# Patient Record
Sex: Female | Born: 1950 | Race: White | Hispanic: No
Health system: Southern US, Community
[De-identification: ages and names within clinical notes are randomized; demographics above are authoritative.]

## PROBLEM LIST (undated history)

## (undated) DIAGNOSIS — F0390 Unspecified dementia without behavioral disturbance: Secondary | ICD-10-CM

## (undated) HISTORY — PX: CHOLECYSTECTOMY: SHX55

## (undated) HISTORY — PX: ARM WOUND REPAIR / CLOSURE: SUR1141

## (undated) HISTORY — PX: NECK SURGERY: SHX720

## (undated) HISTORY — PX: ABDOMINAL HYSTERECTOMY: SHX81

---

## 2021-10-06 ENCOUNTER — Emergency Department (HOSPITAL_BASED_OUTPATIENT_CLINIC_OR_DEPARTMENT_OTHER)
Admission: EM | Admit: 2021-10-06 | Discharge: 2021-10-06 | Disposition: A | Discharge status: Home / Self Care | Payer: Medicare Other | Attending: Emergency Medicine | Admitting: Emergency Medicine

## 2021-10-06 ENCOUNTER — Encounter (HOSPITAL_BASED_OUTPATIENT_CLINIC_OR_DEPARTMENT_OTHER): Payer: Self-pay

## 2021-10-06 ENCOUNTER — Other Ambulatory Visit: Payer: Self-pay

## 2021-10-06 DIAGNOSIS — Z20822 Contact with and (suspected) exposure to covid-19: Secondary | ICD-10-CM | POA: Diagnosis not present

## 2021-10-06 DIAGNOSIS — F0390 Unspecified dementia without behavioral disturbance: Secondary | ICD-10-CM | POA: Insufficient documentation

## 2021-10-06 DIAGNOSIS — B349 Viral infection, unspecified: Secondary | ICD-10-CM | POA: Diagnosis not present

## 2021-10-06 DIAGNOSIS — R059 Cough, unspecified: Secondary | ICD-10-CM | POA: Diagnosis present

## 2021-10-06 HISTORY — DX: Unspecified dementia, unspecified severity, without behavioral disturbance, psychotic disturbance, mood disturbance, and anxiety: F03.90

## 2021-10-06 LAB — RESP PANEL BY RT-PCR (FLU A&B, COVID) ARPGX2
Influenza A by PCR: NEGATIVE
Influenza B by PCR: NEGATIVE
SARS Coronavirus 2 by RT PCR: NEGATIVE

## 2021-10-06 NOTE — ED Provider Notes (Signed)
[facility]   CSN: [id number] Arrival date & time: 10/06/21  1619     History Chief Complaint  Patient presents with   Cough   Fever   Chills    Dawn Mills is a 70 y.o. female with a history of dementia presented due to viral-like syndrome.  Multiple sick family members in the house, including her son, who is currently patient.  This patient has had cough, congestion, body aches, fevers and chills for about 6 days.  She is eating and drinking okay.  HPI     Past Medical History:  Diagnosis Date   Dementia (HCC)     There are no problems to display for this patient.   Past Surgical History:  Procedure Laterality Date   ABDOMINAL HYSTERECTOMY     ARM WOUND REPAIR / CLOSURE     CHOLECYSTECTOMY       OB History   No obstetric history on file.     No family history on file.  Social History   Tobacco Use   Smoking status: Never   Smokeless tobacco: Never  Substance Use Topics   Alcohol use: Never   Drug use: Never    Home Medications Prior to Admission medications   Not on File    Allergies    Patient has no known allergies.  Review of Systems   Review of Systems  Constitutional:  Positive for appetite change, chills, fatigue and fever.  Eyes:  Negative for pain and visual disturbance.  Respiratory:  Positive for cough. Negative for shortness of breath.   Cardiovascular:  Negative for chest pain and palpitations.  Gastrointestinal:  Negative for abdominal pain and vomiting.  Genitourinary:  Negative for dysuria and hematuria.  Musculoskeletal:  Positive for arthralgias and myalgias.  Skin:  Negative for color change and rash.  Neurological:  Positive for headaches. Negative for syncope.  All other systems reviewed and are negative.  Physical Exam Updated Vital Signs BP 135/74 (BP Location: Right Arm)   Pulse 85   Temp 98.2 F (36.8 C)   Resp 17   SpO2 97%   Physical Exam Constitutional:       General: She is not in acute distress. HENT:     Head: Normocephalic and atraumatic.  Eyes:     Conjunctiva/sclera: Conjunctivae normal.     Pupils: Pupils are equal, round, and reactive to light.  Cardiovascular:     Rate and Rhythm: Normal rate and regular rhythm.  Pulmonary:     Effort: Pulmonary effort is normal. No respiratory distress.  Abdominal:     General: There is no distension.     Tenderness: There is no abdominal tenderness.  Skin:    General: Skin is warm and dry.  Neurological:     General: No focal deficit present.     Mental Status: She is alert. Mental status is at baseline.  Psychiatric:        Mood and Affect: Mood normal.        Behavior: Behavior normal.    ED Results / Procedures / Treatments   Labs (all labs ordered are listed, but only abnormal results are displayed) Labs Reviewed  RESP PANEL BY RT-PCR (FLU A&B, COVID) [id number]    EKG None  Radiology No results found.  Procedures Procedures   Medications Ordered in ED Medications - No data to display  ED Course  I have reviewed the triage vital signs and the nursing notes.  Pertinent labs &  imaging results that were available during my care of the patient were reviewed by me and considered in my medical decision making (see chart for details).  Patient is here with suspected viral syndrome, multiple sick family members.  She has no hypoxia, no SIRS criteria, I doubt PE, doubt sepsis or pneumonia.  She is clinically quite well-appearing.  No evidence of distress.  She does have a dry cough which is very mild and intermittent on exam.  Advised conservative measures for suspected viral syndrome.  She appears to be getting better at this point.  Her COVID and flu are negative.  She and her son verbalized understanding     Final Clinical Impression(s) / ED Diagnoses Final diagnoses:  Viral syndrome    Rx / DC Orders ED Discharge Orders     None        Heitor Steinhoff, [name], MD 10/06/21  2344

## 2021-10-06 NOTE — ED Notes (Addendum)
MD at bedside  1911: Report given to [facility] for continuation of care.

## 2021-10-06 NOTE — ED Triage Notes (Signed)
Pt presents with son, per son pt has dementia and need his assistance. Pt reports cough, fever, chills, body aches and fatigue since Thursday. Pt's grandson has similar symptoms, son ain triage here for the same symptoms as well.

## 2021-12-12 ENCOUNTER — Encounter (HOSPITAL_BASED_OUTPATIENT_CLINIC_OR_DEPARTMENT_OTHER): Payer: Self-pay | Admitting: Emergency Medicine

## 2021-12-12 ENCOUNTER — Emergency Department (HOSPITAL_BASED_OUTPATIENT_CLINIC_OR_DEPARTMENT_OTHER)
Admission: EM | Admit: 2021-12-12 | Discharge: 2021-12-12 | Disposition: A | Discharge status: Home / Self Care | Payer: Medicare Other | Attending: Emergency Medicine | Admitting: Emergency Medicine

## 2021-12-12 ENCOUNTER — Other Ambulatory Visit: Payer: Self-pay

## 2021-12-12 DIAGNOSIS — F0390 Unspecified dementia without behavioral disturbance: Secondary | ICD-10-CM | POA: Insufficient documentation

## 2021-12-12 DIAGNOSIS — N390 Urinary tract infection, site not specified: Secondary | ICD-10-CM | POA: Diagnosis not present

## 2021-12-12 DIAGNOSIS — R4182 Altered mental status, unspecified: Secondary | ICD-10-CM | POA: Diagnosis present

## 2021-12-12 LAB — CBC WITH DIFFERENTIAL/PLATELET
Abs Immature Granulocytes: 0.01 10*3/uL (ref 0.00–0.07)
Basophils Absolute: 0 10*3/uL (ref 0.0–0.1)
Basophils Relative: 1 %
Eosinophils Absolute: 0.1 10*3/uL (ref 0.0–0.5)
Eosinophils Relative: 2 %
HCT: 38.4 % (ref 36.0–46.0)
Hemoglobin: 12.8 g/dL (ref 12.0–15.0)
Immature Granulocytes: 0 %
Lymphocytes Relative: 40 %
Lymphs Abs: 1.8 10*3/uL (ref 0.7–4.0)
MCH: 31.2 pg (ref 26.0–34.0)
MCHC: 33.3 g/dL (ref 30.0–36.0)
MCV: 93.7 fL (ref 80.0–100.0)
Monocytes Absolute: 0.4 10*3/uL (ref 0.1–1.0)
Monocytes Relative: 8 %
Neutro Abs: 2.2 10*3/uL (ref 1.7–7.7)
Neutrophils Relative %: 49 %
Platelets: 180 10*3/uL (ref 150–400)
RBC: 4.1 MIL/uL (ref 3.87–5.11)
RDW: 12.5 % (ref 11.5–15.5)
WBC: 4.5 10*3/uL (ref 4.0–10.5)
nRBC: 0 % (ref 0.0–0.2)

## 2021-12-12 LAB — BASIC METABOLIC PANEL
Anion gap: 8 (ref 5–15)
BUN: 13 mg/dL (ref 8–23)
CO2: 27 mmol/L (ref 22–32)
Calcium: 9.1 mg/dL (ref 8.9–10.3)
Chloride: 107 mmol/L (ref 98–111)
Creatinine, Ser: 0.65 mg/dL (ref 0.44–1.00)
GFR, Estimated: >60 mL/min (ref >60)
Glucose, Bld: 130 mg/dL — ABNORMAL HIGH (ref 70–99)
Potassium: 3.4 mmol/L — ABNORMAL LOW (ref 3.5–5.1)
Sodium: 142 mmol/L (ref 135–145)

## 2021-12-12 LAB — URINALYSIS, ROUTINE W REFLEX MICROSCOPIC
Bilirubin Urine: NEGATIVE
Glucose, UA: NEGATIVE mg/dL
Hgb urine dipstick: NEGATIVE
Ketones, ur: NEGATIVE mg/dL
Nitrite: NEGATIVE
Protein, ur: NEGATIVE mg/dL
Specific Gravity, Urine: 1.005 (ref 1.005–1.030)
pH: 5.5 (ref 5.0–8.0)

## 2021-12-12 MED ORDER — CEPHALEXIN 500 MG PO CAPS: 500.0000 mg | ORAL_CAPSULE | Freq: Two times a day (BID) | ORAL | 0 refills | Status: AC

## 2021-12-12 MED ORDER — SODIUM CHLORIDE 0.9 % IV SOLN
1.0000 g | Freq: Once | INTRAVENOUS | Status: AC
  Administered 2021-12-12: 1 g via INTRAVENOUS
  Filled 2021-12-12: qty 10

## 2021-12-12 NOTE — ED Provider Notes (Signed)
[place] EMERGENCY DEPT Provider Note   CSN: [id number] Arrival date & time: 12/12/21  1758     History  Chief Complaint  Patient presents with   Altered Mental Status    Dawn Mills is a 71 y.o. female with a history of dementia presenting to the emergency department with concern for confusion.  The patient's son is present at bedside provides supplemental history.  He reports that the patient moved in with him several months ago from [place], back in March of last year, due to progressively worsening dementia and inability to care for herself.  He says for the past several days she her confusion appears to have worsened, where she is often not making sense and her room is cluttered.  He says that she behaved this way when she had a UTI in the past and wonders whether she may have one now.  He does report he needs additional assistance at home keeping an eye on his mother, as he is her primary caretaker at this time.  They did attempt to establish care with a neurologist at [facility] for dementia, but he says that there was some disagreement between the patient and the neurology office, and he feels that he needs a new neurologist.  The patient herself appears pleasantly demented, has no acute complaints, denies pain anywhere.  HPI     Home Medications Prior to Admission medications   Medication Sig Start Date End Date Taking? Authorizing Provider  cephALEXin (KEFLEX) 500 MG capsule Take 1 capsule (500 mg total) by mouth 2 (two) times daily for 5 days. 12/13/21 12/18/21 Yes Dailynn Nancarrow, Carola Rhine, MD      Allergies    Patient has no known allergies.    Review of Systems   Review of Systems  Physical Exam Updated Vital Signs BP 131/75 (BP Location: Left Arm)    Pulse 76    Temp 98.8 F (37.1 C)    Resp 17    Ht 5\' 4"  (1.626 m)    Wt 61.7 kg    SpO2 99%    BMI 23.34 kg/m  Physical Exam Constitutional:      General: She is not in acute distress. HENT:      Head: Normocephalic and atraumatic.  Eyes:     Conjunctiva/sclera: Conjunctivae normal.     Pupils: Pupils are equal, round, and reactive to light.  Cardiovascular:     Rate and Rhythm: Normal rate and regular rhythm.  Pulmonary:     Effort: Pulmonary effort is normal. No respiratory distress.  Skin:    General: Skin is warm and dry.  Neurological:     General: No focal deficit present.     Mental Status: She is alert. Mental status is at baseline.    ED Results / Procedures / Treatments   Labs (all labs ordered are listed, but only abnormal results are displayed) Labs Reviewed  URINALYSIS, ROUTINE W REFLEX MICROSCOPIC - Abnormal; Notable for the following components:      Result Value   Color, Urine COLORLESS (*)    Leukocytes,Ua MODERATE (*)    Bacteria, UA FEW (*)    Non Squamous Epithelial 0-5 (*)    All other components within normal limits  BASIC METABOLIC PANEL - Abnormal; Notable for the following components:   Potassium 3.4 (*)    Glucose, Bld 130 (*)    All other components within normal limits  URINE CULTURE  CBC WITH DIFFERENTIAL/PLATELET    EKG None  Radiology  No results found.  Procedures Procedures    Medications Ordered in ED Medications  cefTRIAXone (ROCEPHIN) 1 g in sodium chloride 0.9 % 100 mL IVPB (0 g Intravenous Stopped 12/12/21 2020)    ED Course/ Medical Decision Making/ A&P                           Medical Decision Making  This patient presents to the ED with concern for behavioral changes. This involves an extensive number of treatment options, and is a complaint that carries with it a high risk of complications and morbidity.  The differential diagnosis includes worsening dementia vs UTI vs other  Co-morbidities that complicate the patient evaluation: dementia  Additional history obtained from patient's son at bedside  I ordered and personally interpreted labs.  The pertinent results include:  UA with + leuks, neg nitrites, CBC  and BMP wnl   I ordered medication including IV rocephin for UTI I have reviewed the patients home medicines and have made adjustments as needed  Test Considered:  - Doubt sepsis, intraabdominal surgical emergency, or stroke - do not feel additional CT imaging warranted at this time   After the interventions noted above, I reevaluated the patient and found that they have: stayed the same  Social Determinants of Health:dementia - TOC/home health consult placed, after discussion with son  Dispostion:  After consideration of the diagnostic results and the patients response to treatment, I feel that the patent would benefit from antibiotics for likely UTI, outpatient f/u - referral placed to neurology to help expedite office evaluation for his mother's rapidly progressing dementia.  Patient's son in agreement with plan of action. Okay for discharge            Final Clinical Impression(s) / ED Diagnoses Final diagnoses:  Urinary tract infection without hematuria, site unspecified  Dementia, unspecified dementia severity, unspecified dementia type, unspecified whether behavioral, psychotic, or mood disturbance or anxiety ([place])    Rx / DC Orders ED Discharge Orders          Ordered    cephALEXin (KEFLEX) 500 MG capsule  2 times daily        12/12/21 2003    Ambulatory referral to Neurology       Comments: An appointment is requested in approximately: 1 week Evaluation for rapidly progressing dementia, new patient care   12/12/21 2003              Wyvonnia Dusky, MD 12/13/21 1012

## 2021-12-12 NOTE — ED Triage Notes (Signed)
Per patient, she has felt a bit dizzy off and on for past week.  Per son, pt has new dx of dementia from PMD's in [place] where she lived until recently.  Pt's son states his mother has been acting very unusual around the house and that the last time she acted this way she had a UTI.

## 2021-12-12 NOTE — ED Triage Notes (Signed)
Pt alert and talkative during triage.  Denies urinary symptoms.  States dizzy spells come and go with a headache.  Pt recently moved in with son.  Son states pt has "not been acting like herself."  Pt denies any awareness of altered mental state, but acknowledges that she has become irate with her son more often recently.

## 2021-12-13 NOTE — TOC Transition Note (Addendum)
Transition of Care [facility]) - CM/SW Discharge Note   Patient Details  Name: Dawn Mills MRN: [id number] Date of Birth: [birth year]  [facility] CM/SW Contact:  Verdell Carmine, RN Phone Number: 12/13/2021, 1:23 PM   Clinical Narrative:     Home Health orders seen, accepted by [facility] They will call and schedule with patient and family. Jacquenette Shone made aware via phone        Patient Goals and CMS Choice        Discharge Placement             Home with [place]          Discharge Plan and Services                          HH Arranged: PT, OT, Nurse's Aide, Social Work [facility] Agency: [place] Date [place]: 12/13/21 Time [place]: [place] Representative spoke with at [place]: [place] ([place]) Interventions     Readmission Risk Interventions No flowsheet data found.

## 2021-12-13 NOTE — Care Management (Signed)
Called [facility]  to accept for home health.

## 2021-12-14 LAB — URINE CULTURE: Culture: <10000 — AB

## 2021-12-16 ENCOUNTER — Encounter: Payer: Self-pay | Admitting: Physician Assistant

## 2021-12-18 ENCOUNTER — Ambulatory Visit: Payer: Medicare Other | Admitting: Physician Assistant

## 2021-12-22 ENCOUNTER — Emergency Department (HOSPITAL_BASED_OUTPATIENT_CLINIC_OR_DEPARTMENT_OTHER)
Admission: EM | Admit: 2021-12-22 | Discharge: 2021-12-22 | Disposition: A | Discharge status: Home / Self Care | Payer: Medicare Other | Attending: Emergency Medicine | Admitting: Emergency Medicine

## 2021-12-22 ENCOUNTER — Other Ambulatory Visit: Payer: Self-pay

## 2021-12-22 ENCOUNTER — Encounter (HOSPITAL_BASED_OUTPATIENT_CLINIC_OR_DEPARTMENT_OTHER): Payer: Self-pay

## 2021-12-22 DIAGNOSIS — R4689 Other symptoms and signs involving appearance and behavior: Secondary | ICD-10-CM

## 2021-12-22 DIAGNOSIS — R42 Dizziness and giddiness: Secondary | ICD-10-CM | POA: Insufficient documentation

## 2021-12-22 LAB — URINALYSIS, ROUTINE W REFLEX MICROSCOPIC
Bilirubin Urine: NEGATIVE
Glucose, UA: NEGATIVE mg/dL
Hgb urine dipstick: NEGATIVE
Ketones, ur: NEGATIVE mg/dL
Nitrite: NEGATIVE
Protein, ur: NEGATIVE mg/dL
Specific Gravity, Urine: 1.018 (ref 1.005–1.030)
pH: 6.5 (ref 5.0–8.0)

## 2021-12-22 LAB — CBC WITH DIFFERENTIAL/PLATELET
Abs Immature Granulocytes: 0.01 10*3/uL (ref 0.00–0.07)
Basophils Absolute: 0 10*3/uL (ref 0.0–0.1)
Basophils Relative: 1 %
Eosinophils Absolute: 0.1 10*3/uL (ref 0.0–0.5)
Eosinophils Relative: 3 %
HCT: 41.9 % (ref 36.0–46.0)
Hemoglobin: 13.7 g/dL (ref 12.0–15.0)
Immature Granulocytes: 0 %
Lymphocytes Relative: 48 %
Lymphs Abs: 2 10*3/uL (ref 0.7–4.0)
MCH: 31.1 pg (ref 26.0–34.0)
MCHC: 32.7 g/dL (ref 30.0–36.0)
MCV: 95 fL (ref 80.0–100.0)
Monocytes Absolute: 0.4 10*3/uL (ref 0.1–1.0)
Monocytes Relative: 8 %
Neutro Abs: 1.7 10*3/uL (ref 1.7–7.7)
Neutrophils Relative %: 40 %
Platelets: 183 10*3/uL (ref 150–400)
RBC: 4.41 MIL/uL (ref 3.87–5.11)
RDW: 12.3 % (ref 11.5–15.5)
WBC: 4.3 10*3/uL (ref 4.0–10.5)
nRBC: 0 % (ref 0.0–0.2)

## 2021-12-22 LAB — BASIC METABOLIC PANEL
Anion gap: 7 (ref 5–15)
BUN: 13 mg/dL (ref 8–23)
CO2: 29 mmol/L (ref 22–32)
Calcium: 9.6 mg/dL (ref 8.9–10.3)
Chloride: 104 mmol/L (ref 98–111)
Creatinine, Ser: 0.85 mg/dL (ref 0.44–1.00)
GFR, Estimated: >60 mL/min (ref >60)
Glucose, Bld: 91 mg/dL (ref 70–99)
Potassium: 4.3 mmol/L (ref 3.5–5.1)
Sodium: 140 mmol/L (ref 135–145)

## 2021-12-22 MED ORDER — QUETIAPINE FUMARATE 50 MG PO TABS: 50.0000 mg | ORAL_TABLET | Freq: Every day | ORAL | 1 refills | Status: AC

## 2021-12-22 NOTE — Discharge Instructions (Signed)
Please follow-up with a neurologist at the end of the week.  I strongly recommend that you try taking the Seroquel medication an hour before bedtime, this is a good medicine to help you sleep at night.

## 2021-12-22 NOTE — ED Notes (Signed)
Pt verbalizes understanding of discharge instructions. Opportunity for questioning and answers were provided. Pt discharged from ED to home with son.    

## 2021-12-22 NOTE — ED Triage Notes (Signed)
Pt was treated for a UTI on 1/14, but pt's symptoms has not resolved. Pt still having dizzy episodes and a headache that are intermittent.

## 2021-12-22 NOTE — ED Provider Notes (Signed)
[place] EMERGENCY DEPT Provider Note   CSN: [id number] Arrival date & time: 12/22/21  1717     History  Chief Complaint  Patient presents with   Dizziness    Dawn Mills is a 71 y.o. female presented to the ED in the company of her son with concern for continued erratic behavior.  Patient was evaluated by myself approximate 10 days ago in the ER for concern for progressively worsening dementia, her son who is now helping take care of her, and whom the patient lives with, was concerned that the patient is sleeping poorly, is constantly combative and argumentative.  They have been attempting to schedule neurology appointment have one coming up on Friday, and he is seeking an official neurologist diagnosis of dementia in order to search for placement options with her PCP.  However the patient again was having episodes of insomnia and agitation last night, waking up at 3 AM and yelling her son.    The patient herself cannot remember any of his episodes.  In private, the son is out of the room, she reports that " he argues with me all the time, he should not talk to his mother like that".  Her son reports concern that the patient needs constant supervision.  Social work was consulted last time was able to arrange for some home health resources, which has been helpful.  The patient was treated apparently for possible UTI on her last visit, the urine culture in the growing and significant amount of bacteria.  HPI     Home Medications Prior to Admission medications   Medication Sig Start Date End Date Taking? Authorizing Provider  QUEtiapine (SEROQUEL) 50 MG tablet Take 1 tablet (50 mg total) by mouth at bedtime. 12/22/21 01/21/22 Yes Marcile Fuquay, Carola Rhine, MD      Allergies    Patient has no known allergies.    Review of Systems   Review of Systems  Physical Exam Updated Vital Signs BP (!) 121/58    Pulse 70    Temp 98.4 F (36.9 C)    Resp 18    Ht 5\' 4"  (1.626 m)     Wt 56.7 kg    SpO2 98%    BMI 21.46 kg/m  Physical Exam Constitutional:      General: She is not in acute distress. HENT:     Head: Normocephalic and atraumatic.  Eyes:     Conjunctiva/sclera: Conjunctivae normal.     Pupils: Pupils are equal, round, and reactive to light.  Cardiovascular:     Rate and Rhythm: Normal rate and regular rhythm.  Pulmonary:     Effort: Pulmonary effort is normal. No respiratory distress.  Skin:    General: Skin is warm and dry.  Neurological:     General: No focal deficit present.     Mental Status: She is alert. Mental status is at baseline.  Psychiatric:        Mood and Affect: Mood normal.        Behavior: Behavior normal.    ED Results / Procedures / Treatments   Labs (all labs ordered are listed, but only abnormal results are displayed) Labs Reviewed  URINALYSIS, ROUTINE W REFLEX MICROSCOPIC - Abnormal; Notable for the following components:      Result Value   Leukocytes,Ua LARGE (*)    All other components within normal limits  CBC WITH DIFFERENTIAL/PLATELET  BASIC METABOLIC PANEL    EKG None  Radiology No results found.  Procedures Procedures  Medications Ordered in ED Medications - No data to display  ED Course/ Medical Decision Making/ A&P                           Medical Decision Making Amount and/or Complexity of Data Reviewed Labs: ordered.   Patient is here with continued behavioral changes, which are highly consistent with worsening dementia.  She has a neurology appointment at the end of the week.  Her son is looking for additional medication assistance at home, particularly with insomnia issue, and I have discussed with him and the patient initiating Seroquel at night.  The patient is skeptical of taking sleep medications, but was amenable to trying this for 2 or 3 weeks.  Otherwise I reviewed her blood test today and interpreted them, with no acute or significant findings.  UA is likely contaminated with  persistent leukocytes, negative nitrites, but she did not grow anything in the urine last night.  I do not think she has a recurrent UTI.  I also doubt intracranial tumor, meningitis, or other life-threatening emergencies.  She is not demonstrating signs of acute psychosis at this time, and I do not think needs an involuntary commitment for psychiatric stabilization.        Final Clinical Impression(s) / ED Diagnoses Final diagnoses:  None    Rx / DC Orders ED Discharge Orders          Ordered    QUEtiapine (SEROQUEL) 50 MG tablet  Daily at bedtime        12/22/21 1929              Wyvonnia Dusky, MD 12/22/21 [phone]

## 2021-12-23 ENCOUNTER — Emergency Department (HOSPITAL_COMMUNITY): Payer: Medicare Other

## 2021-12-23 ENCOUNTER — Emergency Department (HOSPITAL_COMMUNITY)
Admission: EM | Admit: 2021-12-23 | Discharge: 2021-12-23 | Disposition: A | Discharge status: Home / Self Care | Payer: Medicare Other | Attending: Student | Admitting: Student

## 2021-12-23 ENCOUNTER — Encounter (HOSPITAL_COMMUNITY): Payer: Self-pay | Admitting: Emergency Medicine

## 2021-12-23 DIAGNOSIS — F03B18 Unspecified dementia, moderate, with other behavioral disturbance: Secondary | ICD-10-CM

## 2021-12-23 DIAGNOSIS — Z131 Encounter for screening for diabetes mellitus: Secondary | ICD-10-CM | POA: Insufficient documentation

## 2021-12-23 DIAGNOSIS — R451 Restlessness and agitation: Secondary | ICD-10-CM | POA: Diagnosis not present

## 2021-12-23 DIAGNOSIS — R519 Headache, unspecified: Secondary | ICD-10-CM | POA: Diagnosis not present

## 2021-12-23 DIAGNOSIS — F03918 Unspecified dementia, unspecified severity, with other behavioral disturbance: Secondary | ICD-10-CM | POA: Insufficient documentation

## 2021-12-23 DIAGNOSIS — R4182 Altered mental status, unspecified: Secondary | ICD-10-CM | POA: Diagnosis present

## 2021-12-23 LAB — COMPREHENSIVE METABOLIC PANEL
ALT: 12 U/L (ref 0–44)
AST: 25 U/L (ref 15–41)
Albumin: 4.1 g/dL (ref 3.5–5.0)
Alkaline Phosphatase: 101 U/L (ref 38–126)
Anion gap: 8 (ref 5–15)
BUN: 12 mg/dL (ref 8–23)
CO2: 27 mmol/L (ref 22–32)
Calcium: 9.3 mg/dL (ref 8.9–10.3)
Chloride: 104 mmol/L (ref 98–111)
Creatinine, Ser: 0.76 mg/dL (ref 0.44–1.00)
GFR, Estimated: >60 mL/min (ref >60)
Glucose, Bld: 96 mg/dL (ref 70–99)
Potassium: 3.9 mmol/L (ref 3.5–5.1)
Sodium: 139 mmol/L (ref 135–145)
Total Bilirubin: 0.8 mg/dL (ref 0.3–1.2)
Total Protein: 7.6 g/dL (ref 6.5–8.1)

## 2021-12-23 LAB — CBC WITH DIFFERENTIAL/PLATELET
Abs Immature Granulocytes: 0.01 10*3/uL (ref 0.00–0.07)
Basophils Absolute: 0 10*3/uL (ref 0.0–0.1)
Basophils Relative: 1 %
Eosinophils Absolute: 0.1 10*3/uL (ref 0.0–0.5)
Eosinophils Relative: 2 %
HCT: 46.8 % — ABNORMAL HIGH (ref 36.0–46.0)
Hemoglobin: 15.7 g/dL — ABNORMAL HIGH (ref 12.0–15.0)
Immature Granulocytes: 0 %
Lymphocytes Relative: 32 %
Lymphs Abs: 1.9 10*3/uL (ref 0.7–4.0)
MCH: 32 pg (ref 26.0–34.0)
MCHC: 33.5 g/dL (ref 30.0–36.0)
MCV: 95.5 fL (ref 80.0–100.0)
Monocytes Absolute: 0.3 10*3/uL (ref 0.1–1.0)
Monocytes Relative: 5 %
Neutro Abs: 3.6 10*3/uL (ref 1.7–7.7)
Neutrophils Relative %: 60 %
Platelets: 204 10*3/uL (ref 150–400)
RBC: 4.9 MIL/uL (ref 3.87–5.11)
RDW: 12.1 % (ref 11.5–15.5)
WBC: 5.9 10*3/uL (ref 4.0–10.5)
nRBC: 0 % (ref 0.0–0.2)

## 2021-12-23 LAB — URINALYSIS, ROUTINE W REFLEX MICROSCOPIC
Bilirubin Urine: NEGATIVE
Glucose, UA: NEGATIVE mg/dL
Hgb urine dipstick: NEGATIVE
Ketones, ur: NEGATIVE mg/dL
Nitrite: NEGATIVE
Protein, ur: NEGATIVE mg/dL
Specific Gravity, Urine: 1.012 (ref 1.005–1.030)
pH: 8 (ref 5.0–8.0)

## 2021-12-23 IMAGING — CT CT HEAD W/O CM
4 of 8 series · 16 of 47 positions shown, 17 images · non-contrast
Comparison: None.

CLINICAL DATA: Severe left-sided headache for several days.



[Series 3: head wo · axial · 0.41mm/px · z∈[-174,-84]mm · 4 of 32 slices shown, 5 images]
[im 7/32  brain]
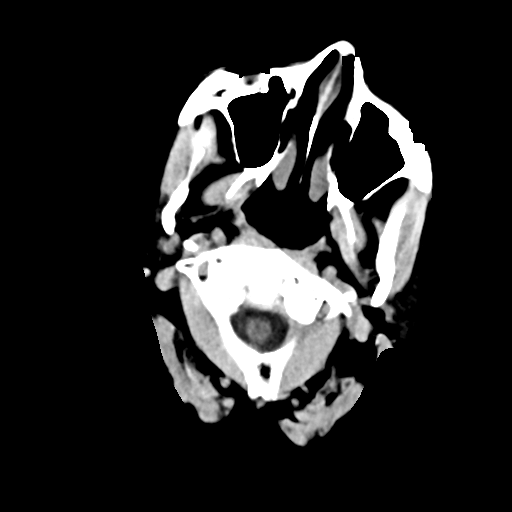
[im 7/32  bone]
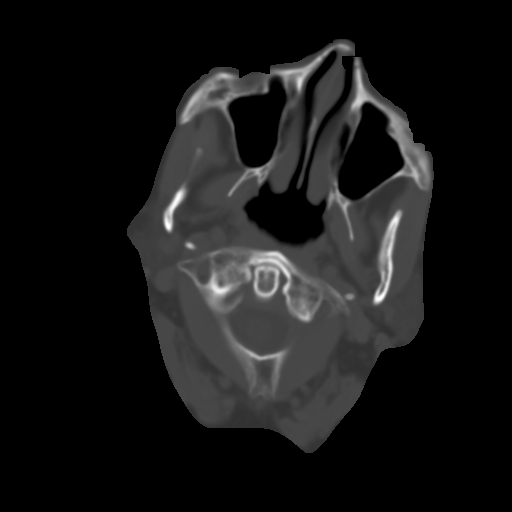
[im 13/32  brain]
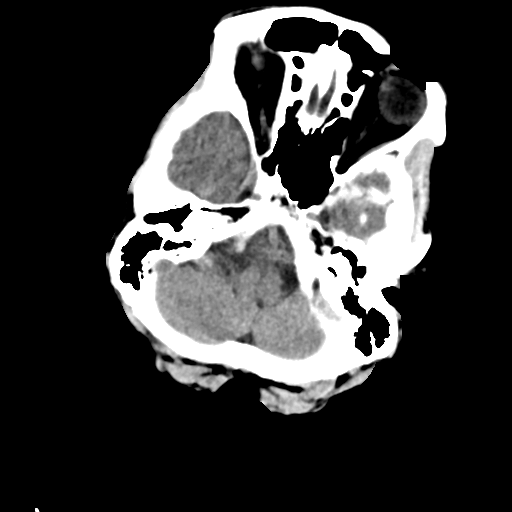
[im 19/32  brain]
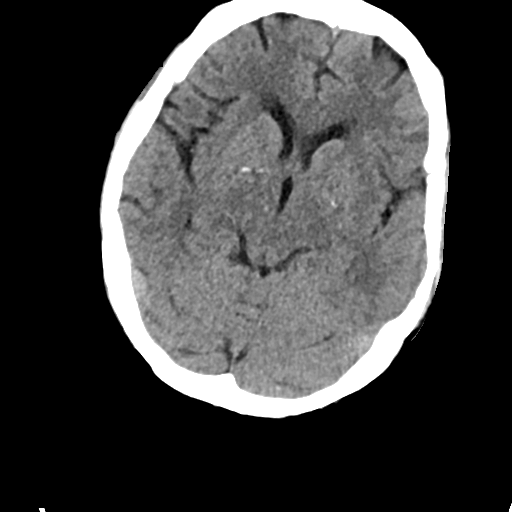
[im 25/32  brain]
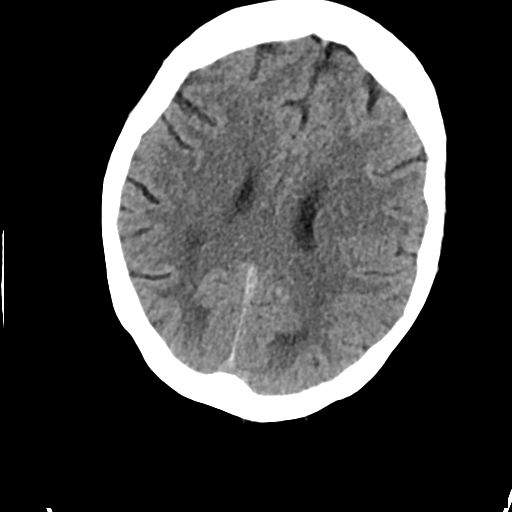

[Series 5: head bone · axial · 0.41mm/px · z∈[-194,-84]mm · 7 of 78 slices shown]
[im 6/78  bone]
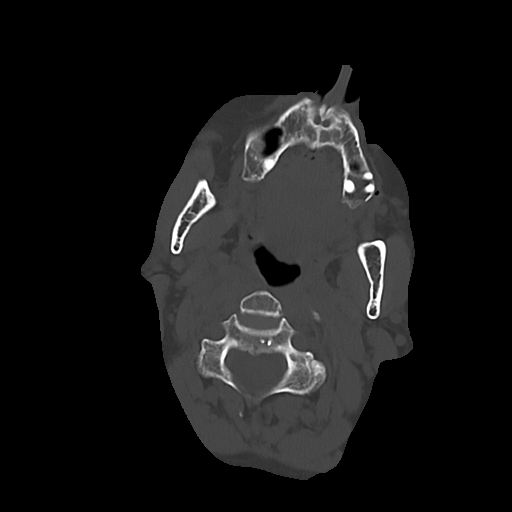
[im 17/78  bone]
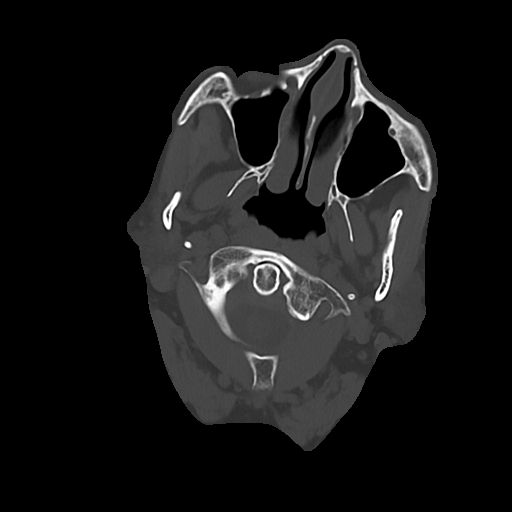
[im 28/78  bone]
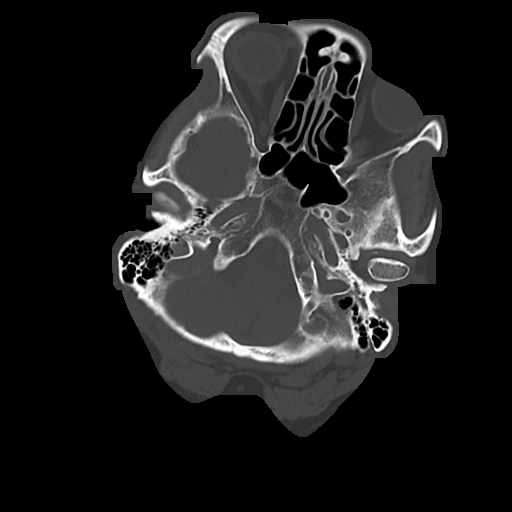
[im 34/78  bone]
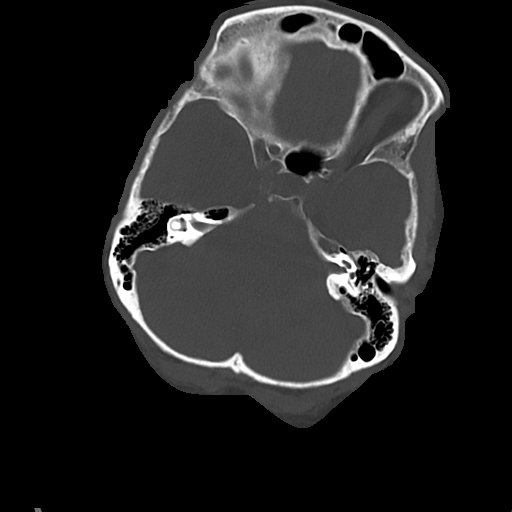
[im 45/78  bone]
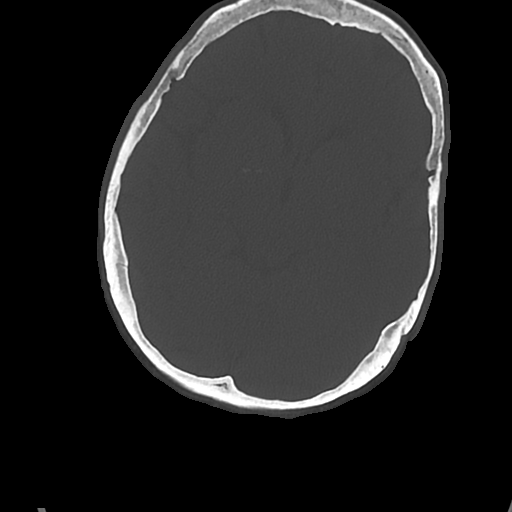
[im 50/78  bone]
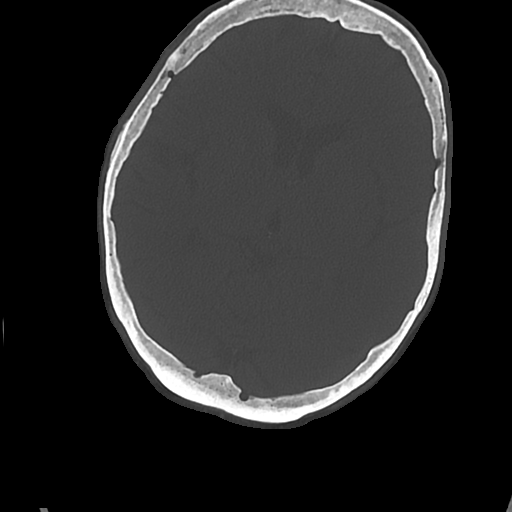
[im 61/78  bone]
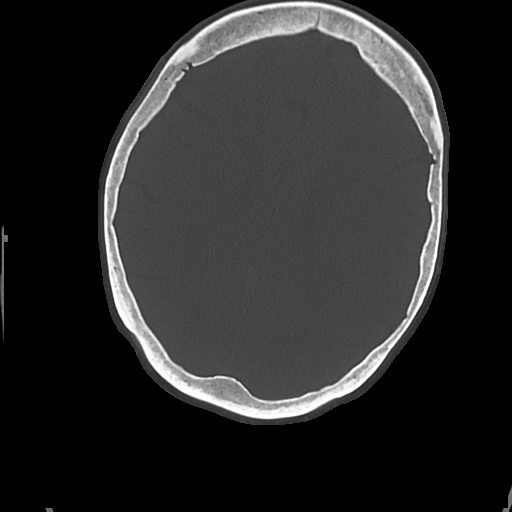

[Series 7: cor soft · coronal · 0.31mm/px · 3 of 74 slices shown]
[im 19/74  brain]
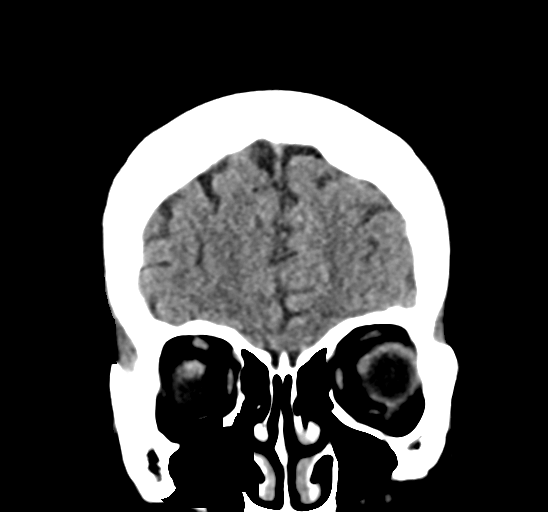
[im 37/74  brain]
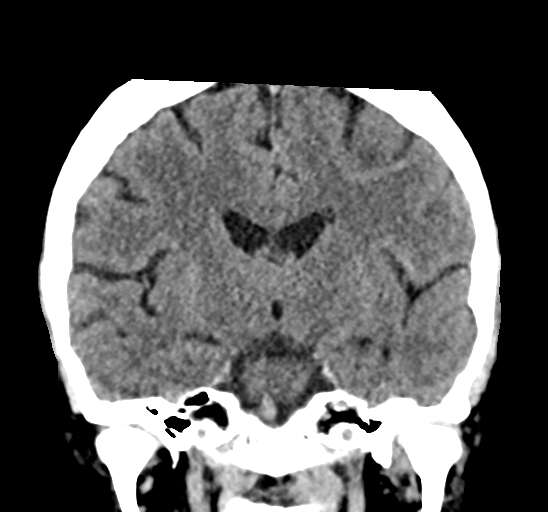
[im 55/74  brain]
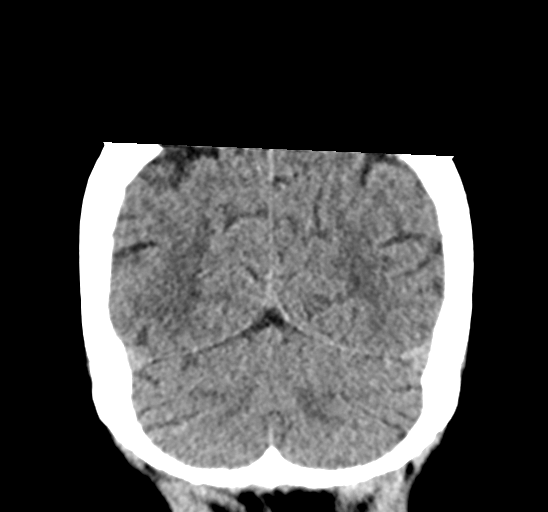

[Series 10: sag soft · sagittal · 0.17mm/px · 2 of 60 slices shown]
[im 20/60  brain]
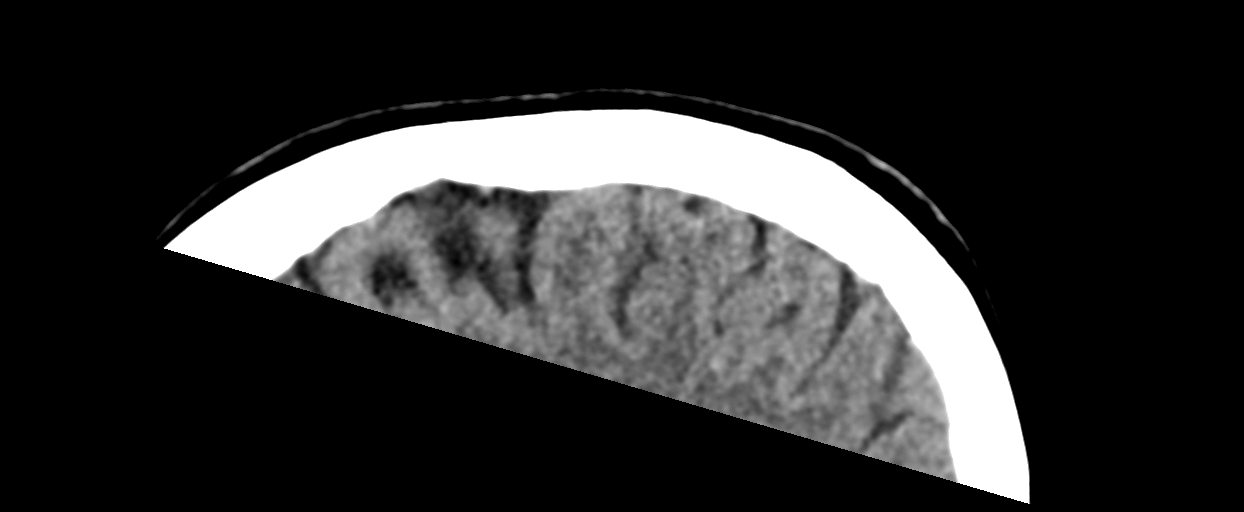
[im 40/60  brain]
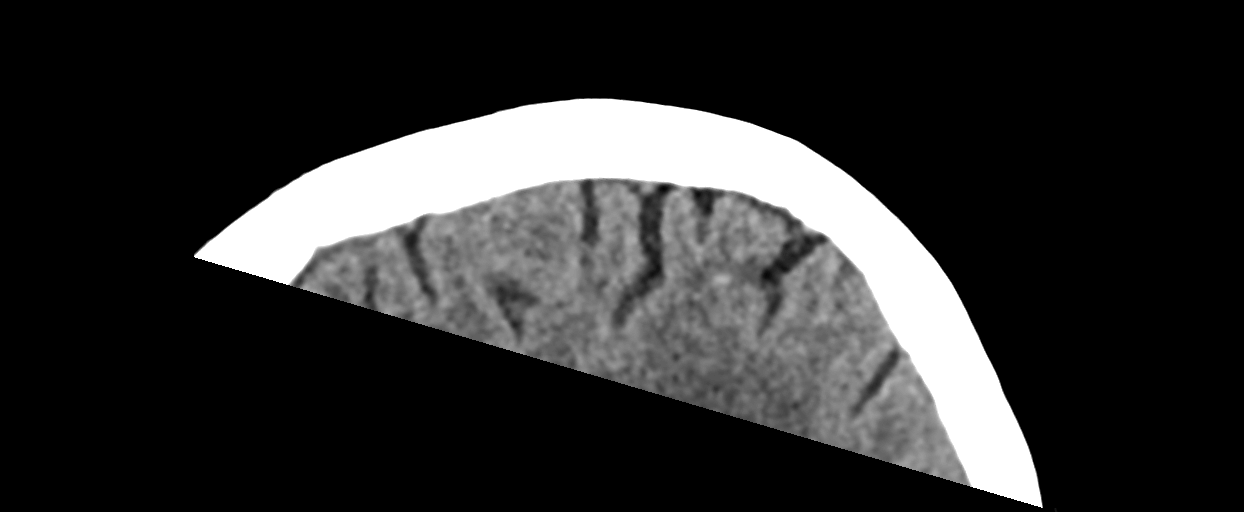

[16 of 47 positions shown; findings below may reference images not displayed]

FINDINGS: Brain: No evidence of acute infarction, hemorrhage, hydrocephalus,
extra-axial collection, or mass lesion/mass effect. Mild chronic
small vessel disease is noted.

Vascular:  No hyperdense vessel or other acute findings.

Skull: No evidence of fracture or other significant bone
abnormality.

Sinuses/Orbits:  No acute findings.

Other: None.
IMPRESSION: No acute intracranial abnormality.

Mild chronic small vessel disease.

## 2021-12-23 MED ORDER — KETOROLAC TROMETHAMINE 15 MG/ML IJ SOLN
15.0000 mg | Freq: Once | INTRAMUSCULAR | Status: AC
Start: 2021-12-23 — End: 2021-12-23
  Administered 2021-12-23: 19:00:00 15 mg via INTRAVENOUS
  Filled 2021-12-23: qty 1

## 2021-12-23 MED ORDER — DIPHENHYDRAMINE HCL 50 MG/ML IJ SOLN
25.0000 mg | Freq: Once | INTRAMUSCULAR | Status: AC
  Administered 2021-12-23: 19:00:00 25 mg via INTRAVENOUS
  Filled 2021-12-23: qty 1

## 2021-12-23 MED ORDER — SODIUM CHLORIDE 0.9 % IV BOLUS
1000.0000 mL | Freq: Once | INTRAVENOUS | Status: AC
  Administered 2021-12-23: 17:00:00 1000 mL via INTRAVENOUS

## 2021-12-23 MED ORDER — ACETAMINOPHEN 325 MG PO TABS
650.0000 mg | ORAL_TABLET | Freq: Once | ORAL | Status: AC
  Administered 2021-12-23: 17:00:00 650 mg via ORAL
  Filled 2021-12-23: qty 2

## 2021-12-23 MED ORDER — PROCHLORPERAZINE EDISYLATE 10 MG/2ML IJ SOLN
10.0000 mg | Freq: Once | INTRAMUSCULAR | Status: AC
  Administered 2021-12-23: 19:00:00 10 mg via INTRAVENOUS
  Filled 2021-12-23: qty 2

## 2021-12-23 MED ORDER — QUETIAPINE FUMARATE 50 MG PO TABS
50.0000 mg | ORAL_TABLET | Freq: Every day | ORAL | Status: DC
  Administered 2021-12-23: 22:00:00 50 mg via ORAL
  Filled 2021-12-23: qty 1

## 2021-12-23 NOTE — ED Provider Notes (Signed)
[facility] Provider Note   CSN: [id number] Arrival date & time: 12/23/21  1609     History  Chief Complaint  Patient presents with   Headache   Patient with progressive dementia, difficult to obtain clear history, additional history obtained from her son.  Dawn Mills is a 71 y.o. female she has had several emergency department visits in the last week.  She presents with her son for concern of erratic behavior, new headache is worse on the left side.  Son reports that she is sleeping poorly, is combative, argumentative.  They have a neurology appointment for formal diagnosis of dementia on Friday.  Attempting to secure placement options for care facility.  Son reports that she has frequent nighttime awakenings, agitation.  Since yesterday patient, her son report that she has new left-sided headache, radiating into the neck.  Patient denies any vision changes, numbness, tingling, has not tried anything for headache at this time.  Level 5 caveat: Dementia   Headache     Home Medications Prior to Admission medications   Medication Sig Start Date End Date Taking? Authorizing Provider  QUEtiapine (SEROQUEL) 50 MG tablet Take 1 tablet (50 mg total) by mouth at bedtime. 12/22/21 01/21/22  Wyvonnia Dusky, MD      Allergies    Patient has no known allergies.    Review of Systems   Review of Systems  Reason unable to perform ROS: Dementia.  Neurological:  Positive for headaches.   Physical Exam Updated Vital Signs BP 140/80    Pulse 60    Temp 98.5 F (36.9 C) (Oral)    Resp 17    SpO2 98%  Physical Exam  ED Results / Procedures / Treatments   Labs (all labs ordered are listed, but only abnormal results are displayed) Labs Reviewed  CBC WITH DIFFERENTIAL/PLATELET - Abnormal; Notable for the following components:      Result Value   Hemoglobin 15.7 (*)    HCT 46.8 (*)    All other components within normal limits  URINALYSIS,  ROUTINE W REFLEX MICROSCOPIC - Abnormal; Notable for the following components:   Leukocytes,Ua LARGE (*)    Bacteria, UA RARE (*)    Non Squamous Epithelial 0-5 (*)    All other components within normal limits  COMPREHENSIVE METABOLIC PANEL    EKG None  Radiology CT Head Wo Contrast  Result Date: 12/23/2021 CLINICAL DATA:  Severe left-sided headache for several days. EXAM: CT HEAD WITHOUT CONTRAST TECHNIQUE: Contiguous axial images were obtained from the base of the skull through the vertex without intravenous contrast. RADIATION DOSE REDUCTION: This exam was performed according to the departmental dose-optimization program which includes automated exposure control, adjustment of the mA and/or kV according to patient size and/or use of iterative reconstruction technique. COMPARISON:  None. FINDINGS: Brain: No evidence of acute infarction, hemorrhage, hydrocephalus, extra-axial collection, or mass lesion/mass effect. Mild chronic small vessel disease is noted. Vascular:  No hyperdense vessel or other acute findings. Skull: No evidence of fracture or other significant bone abnormality. Sinuses/Orbits:  No acute findings. Other: None. IMPRESSION: No acute intracranial abnormality. Mild chronic small vessel disease. Electronically Signed   By: Marlaine Hind M.D.   On: 12/23/2021 18:10    Procedures Procedures    Medications Ordered in ED Medications  QUEtiapine (SEROQUEL) tablet 50 mg (has no administration in time range)  acetaminophen (TYLENOL) tablet 650 mg (650 mg Oral Given 12/23/21 1723)  sodium chloride 0.9 % bolus 1,000  mL (1,000 mLs Intravenous New Bag/Given 12/23/21 1722)  ketorolac (TORADOL) 15 MG/ML injection 15 mg (15 mg Intravenous Given 12/23/21 1918)  prochlorperazine (COMPAZINE) injection 10 mg (10 mg Intravenous Given 12/23/21 1918)  diphenhydrAMINE (BENADRYL) injection 25 mg (25 mg Intravenous Given 12/23/21 1918)    ED Course/ Medical Decision Making/ A&P                            Medical Decision Making Amount and/or Complexity of Data Reviewed Labs: ordered. Radiology: ordered.  Risk OTC drugs. Prescription drug management.  I discussed this case with my attending physician who cosigned this note including patient's presenting symptoms, physical exam, and planned diagnostics and interventions. Attending physician stated agreement with plan or made changes to plan which were implemented.   Attending physician assessed patient at bedside.  This patient presents to the ED for concern of progressive dementia, as well as new onset left-sided headache since yesterday, this involves an extensive number of treatment options, and is a complaint that carries with it a high risk of complications and morbidity. The emergent differential diagnosis includes, but is not limited to, worsening dementia, delirium secondary to infection, sepsis, intracranial hemorrhage, dural venous thrombosis, brain mass, intoxication, psychosis versus other.  This is not an exhaustive differential.  Co morbidities that complicate the patient evaluation: Dementia, not currently on medication due to no formal diagnosis. Social Determinants of Health: Patient is currently being cared for by her son who is struggling with taking care of his increasingly demented mother alone without assistance.  Additional history obtained from patient's son. External records from outside source obtained and reviewed including multiple recent emergency department visits.  Physical Exam: Physical exam performed. The pertinent findings include: No focal neurologic deficits, patient who is alert and oriented only to herself, however is able to follow commands, with reasonable thought content.  No significant tenderness to palpation, skin abnormalities, heart or lung sounds to suggest infection, murmur, or other abnormality.  Lab Tests: I Ordered, and personally interpreted labs.  The pertinent results include: CBC is  unremarkable, CMP that is unremarkable.  Urinalysis does continue to show persistent leukocytes, however patient denies dysuria, there are no white blood cells, and rare bacteria, do not believe that this represents a true urinary tract infection at this time.   Imaging Studies: I ordered imaging studies including CT head without contrast. I independently visualized and interpreted imaging which showed no evidence of intracranial abnormality, chronic small vessel ischemic changes. I agree with the radiologist interpretation.  In the context of negative imaging study we will attempt to treat patient's headache with migraine cocktail at this time.   Cardiac Monitoring:  The patient was maintained on a cardiac monitor.  My attending physician Dr. Posey Rea viewed and interpreted the cardiac monitored which showed an underlying rhythm of: Sinus rhythm   Medications: I ordered medication including Tylenol, fluid bolus, Benadryl, Toradol, Compazine for headache. Reevaluation of the patient after these medicines showed that the patient resolved. I have reviewed the patients home medicines and have made adjustments as needed.   Disposition: After consideration of the diagnostic results and the patients response to treatment, I feel that patient would benefit from continued home health, social work consults, follow-up with neurologist for official diagnosis of dementia, and placement in long-term memory care facility.  I do not believe that patient shows any signs of acute psychosis.  Her headache is resolved at this time, her work-up was  reassuring.  We will administer 1 dose of Seroquel prior to discharge so that patient hopefully sleeps through the night, has fewer sundowning symptoms.  Patient discharged in stable condition at this time, return precautions given. Final Clinical Impression(s) / ED Diagnoses Final diagnoses:  Moderate dementia with other behavioral disturbance, unspecified dementia type     Rx / DC Orders ED Discharge Orders     None         Dorien Chihuahua 12/23/21 2210    Teressa Lower, MD 12/23/21 2324

## 2021-12-23 NOTE — ED Triage Notes (Signed)
Pt from home via [facility] with c/o left sided headache. Pt seen for similar last night but the headache has gotten worse.

## 2021-12-24 LAB — CBG MONITORING, ED: Glucose-Capillary: 115 mg/dL — ABNORMAL HIGH (ref 70–99)

## 2021-12-25 ENCOUNTER — Other Ambulatory Visit (INDEPENDENT_AMBULATORY_CARE_PROVIDER_SITE_OTHER): Payer: Medicare Other

## 2021-12-25 ENCOUNTER — Ambulatory Visit (INDEPENDENT_AMBULATORY_CARE_PROVIDER_SITE_OTHER): Payer: Medicare Other | Admitting: Physician Assistant

## 2021-12-25 ENCOUNTER — Other Ambulatory Visit: Payer: Self-pay

## 2021-12-25 ENCOUNTER — Encounter: Payer: Self-pay | Admitting: Physician Assistant

## 2021-12-25 VITALS — BP 134/68 | HR 67 | Ht 64.0 in | Wt 135.2 lb

## 2021-12-25 DIAGNOSIS — F02818 Dementia in other diseases classified elsewhere, unspecified severity, with other behavioral disturbance: Secondary | ICD-10-CM

## 2021-12-25 DIAGNOSIS — Z8659 Personal history of other mental and behavioral disorders: Secondary | ICD-10-CM

## 2021-12-25 DIAGNOSIS — G309 Alzheimer's disease, unspecified: Secondary | ICD-10-CM | POA: Diagnosis not present

## 2021-12-25 NOTE — Patient Instructions (Addendum)
It was a pleasure to see you today at our office.   Recommendations:  Meds: Follow up in 3 months MRI brain  Recommend 24/7  care, may need either sitter or memory care  Recommend referral to psychiatry  Check TSH and B12  Will start antidementia meds after MRI results, as she is on another medication 2 days ago that may interact     RECOMMENDATIONS FOR ALL PATIENTS WITH MEMORY PROBLEMS: 1. Continue to exercise (Recommend 30 minutes of walking everyday, or 3 hours every week) 2. Increase social interactions - continue going to [place] and enjoy social gatherings with friends and family 3. Eat healthy, avoid fried foods and eat more fruits and vegetables 4. Maintain adequate blood pressure, blood sugar, and blood cholesterol level. Reducing the risk of stroke and cardiovascular disease also helps promoting better memory. 5. Avoid stressful situations. Live a simple life and avoid aggravations. Organize your time and prepare for the next day in anticipation. 6. Sleep well, avoid any interruptions of sleep and avoid any distractions in the bedroom that may interfere with adequate sleep quality 7. Avoid sugar, avoid sweets as there is a strong link between excessive sugar intake, diabetes, and cognitive impairment We discussed the Mediterranean diet, which has been shown to help patients reduce the risk of progressive memory disorders and reduces cardiovascular risk. This includes eating fish, eat fruits and green leafy vegetables, nuts like almonds and hazelnuts, walnuts, and also use olive oil. Avoid fast foods and fried foods as much as possible. Avoid sweets and sugar as sugar use has been linked to worsening of memory function.  There is always a concern of gradual progression of memory problems. If this is the case, then we may need to adjust level of care according to patient needs. Support, both to the patient and caregiver, should then be put into place.    The [employer]  is here all day, every day for people facing Alzheimers disease through our free 24/7 Helpline: 903-210-1087. The Helpline provides reliable information and support to all those who need assistance, such as individuals living with memory loss, Alzheimer's or other dementia, caregivers, health care professionals and the public.  Our highly trained and knowledgeable staff can help you with: Understanding memory loss, dementia and Alzheimer's  Medications and other treatment options  General information about aging and brain health  Skills to provide quality care and to find the best care from professionals  Legal, financial and living-arrangement decisions Our Helpline also features: Confidential care consultation provided by master's level clinicians who can help with decision-making support, crisis assistance and education on issues families face every day  Help in a caller's preferred language using our translation service that features more than 200 languages and dialects  Referrals to local community programs, services and ongoing support     FALL PRECAUTIONS: Be cautious when walking. Scan the area for obstacles that may increase the risk of trips and falls. When getting up in the mornings, sit up at the edge of the bed for a few minutes before getting out of bed. Consider elevating the bed at the head end to avoid drop of blood pressure when getting up. Walk always in a well-lit room (use night lights in the walls). Avoid area rugs or power cords from appliances in the middle of the walkways. Use a walker or a cane if necessary and consider physical therapy for balance exercise. Get your eyesight checked regularly.  FINANCIAL OVERSIGHT: Supervision, especially oversight when making  financial decisions or transactions is also recommended.  HOME SAFETY: Consider the safety of the kitchen when operating appliances like stoves, microwave oven, and blender. Consider having supervision and share  cooking responsibilities until no longer able to participate in those. Accidents with firearms and other hazards in the house should be identified and addressed as well.   ABILITY TO BE LEFT ALONE: If patient is unable to contact 911 operator, consider using LifeLine, or when the need is there, arrange for someone to stay with patients. Smoking is a fire hazard, consider supervision or cessation. Risk of wandering should be assessed by caregiver and if detected at any point, supervision and safe proof recommendations should be instituted.  MEDICATION SUPERVISION: Inability to self-administer medication needs to be constantly addressed. Implement a mechanism to ensure safe administration of the medications.   DRIVING: Regarding driving, in patients with progressive memory problems, driving will be impaired. We advise to have someone else do the driving if trouble finding directions or if minor accidents are reported. Independent driving assessment is available to determine safety of driving.    The [employer]ers Association is here all day, every day for people facing Alzheimers disease through our free 24/7 Helpline: (718)059-4573. The Helpline provides reliable information and support to all those who need assistance, such as individuals living with memory loss, Alzheimer's or other dementia, caregivers, health care professionals and the public.  Our highly trained and knowledgeable staff can help you with: Understanding memory loss, dementia and Alzheimer's  Medications and other treatment options  General information about aging and brain health  Skills to provide quality care and to find the best care from professionals  Legal, financial and living-arrangement decisions Our Helpline also features: Confidential care consultation provided by master's level clinicians who can help with decision-making support, crisis assistance and education on issues families face every day  Help in a caller's  preferred language using our translation service that features more than 200 languages and dialects  Referrals to local community programs, services and ongoing support   Mediterranean Diet A Mediterranean diet refers to food and lifestyle choices that are based on the traditions of countries located on the [place]. This way of eating has been shown to help prevent certain conditions and improve outcomes for people who have chronic diseases, like kidney disease and heart disease. What are tips for following this plan? Lifestyle  Cook and eat meals together with your family, when possible. Drink enough fluid to keep your urine clear or pale yellow. Be physically active every day. This includes: Aerobic exercise like running or swimming. Leisure activities like gardening, walking, or housework. Get 7-8 hours of sleep each night. If recommended by your health care provider, drink red wine in moderation. This means 1 glass a day for nonpregnant women and 2 glasses a day for men. A glass of wine equals 5 oz (150 mL). Reading food labels  Check the serving size of packaged foods. For foods such as rice and pasta, the serving size refers to the amount of cooked product, not dry. Check the total fat in packaged foods. Avoid foods that have saturated fat or trans fats. Check the ingredients list for added sugars, such as corn syrup. Shopping  At the grocery store, buy most of your food from the areas near the walls of the store. This includes: Fresh fruits and vegetables (produce). Grains, beans, nuts, and seeds. Some of these may be available in unpackaged forms or large amounts (in bulk). Fresh seafood.  Poultry and eggs. Low-fat dairy products. Buy whole ingredients instead of prepackaged foods. Buy fresh fruits and vegetables in-season from local farmers markets. Buy frozen fruits and vegetables in resealable bags. If you do not have access to quality fresh seafood, buy precooked  frozen shrimp or canned fish, such as tuna, salmon, or sardines. Buy small amounts of raw or cooked vegetables, salads, or olives from the deli or salad bar at your store. Stock your pantry so you always have certain foods on hand, such as olive oil, canned tuna, canned tomatoes, rice, pasta, and beans. Cooking  Cook foods with extra-virgin olive oil instead of using butter or other vegetable oils. Have meat as a side dish, and have vegetables or grains as your main dish. This means having meat in small portions or adding small amounts of meat to foods like pasta or stew. Use beans or vegetables instead of meat in common dishes like chili or lasagna. Experiment with different cooking methods. Try roasting or broiling vegetables instead of steaming or sauteing them. Add frozen vegetables to soups, stews, pasta, or rice. Add nuts or seeds for added healthy fat at each meal. You can add these to yogurt, salads, or vegetable dishes. Marinate fish or vegetables using olive oil, lemon juice, garlic, and fresh herbs. Meal planning  Plan to eat 1 vegetarian meal one day each week. Try to work up to 2 vegetarian meals, if possible. Eat seafood 2 or more times a week. Have healthy snacks readily available, such as: Vegetable sticks with hummus. Greek yogurt. Fruit and nut trail mix. Eat balanced meals throughout the week. This includes: Fruit: 2-3 servings a day Vegetables: 4-5 servings a day Low-fat dairy: 2 servings a day Fish, poultry, or lean meat: 1 serving a day Beans and legumes: 2 or more servings a week Nuts and seeds: 1-2 servings a day Whole grains: 6-8 servings a day Extra-virgin olive oil: 3-4 servings a day Limit red meat and sweets to only a few servings a month What are my food choices? Mediterranean diet Recommended Grains: Whole-grain pasta. Brown rice. Bulgar wheat. Polenta. Couscous. Whole-wheat bread. Orpah Cobb. Vegetables: Artichokes. Beets. Broccoli. Cabbage.  Carrots. Eggplant. Green beans. Chard. Kale. Spinach. Onions. Leeks. Peas. Squash. Tomatoes. Peppers. Radishes. Fruits: Apples. Apricots. Avocado. Berries. Bananas. Cherries. Dates. Figs. Grapes. Lemons. Melon. Oranges. Peaches. Plums. Pomegranate. Meats and other protein foods: Beans. Almonds. Sunflower seeds. Pine nuts. Peanuts. Cod. Salmon. Scallops. Shrimp. Tuna. Tilapia. Clams. Oysters. Eggs. Dairy: Low-fat milk. Cheese. Greek yogurt. Beverages: Water. Red wine. Herbal tea. Fats and oils: Extra virgin olive oil. Avocado oil. Grape seed oil. Sweets and desserts: [place] yogurt with honey. Baked apples. Poached pears. Trail mix. Seasoning and other foods: Basil. Cilantro. Coriander. Cumin. Mint. Parsley. Sage. Rosemary. Tarragon. Garlic. Oregano. Thyme. Pepper. Balsalmic vinegar. Tahini. Hummus. Tomato sauce. Olives. Mushrooms. Limit these Grains: Prepackaged pasta or rice dishes. Prepackaged cereal with added sugar. Vegetables: Deep fried potatoes (french fries). Fruits: Fruit canned in syrup. Meats and other protein foods: Beef. Pork. Lamb. Poultry with skin. Hot dogs. Tomasa Blase. Dairy: Ice cream. Sour cream. Whole milk. Beverages: Juice. Sugar-sweetened soft drinks. Beer. Liquor and spirits. Fats and oils: Butter. Canola oil. Vegetable oil. Beef fat (tallow). Lard. Sweets and desserts: Cookies. Cakes. Pies. Candy. Seasoning and other foods: Mayonnaise. Premade sauces and marinades. The items listed may not be a complete list. Talk with your dietitian about what dietary choices are right for you. Summary The Mediterranean diet includes both food and lifestyle choices. Eat a variety  of fresh fruits and vegetables, beans, nuts, seeds, and whole grains. Limit the amount of red meat and sweets that you eat. Talk with your health care provider about whether it is safe for you to drink red wine in moderation. This means 1 glass a day for nonpregnant women and 2 glasses a day for men. A glass of wine  equals 5 oz (150 mL). This information is not intended to replace advice given to you by your health care provider. Make sure you discuss any questions you have with your health care provider. Document Released: 07/08/2016 Document Revised: 08/10/2016 Document Reviewed: 07/08/2016 Elsevier Interactive Patient Education  2017 [employer]            .
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
## 2021-12-26 LAB — TSH: TSH: 1.24 mIU/L (ref 0.40–4.50)

## 2021-12-26 LAB — VITAMIN B12: Vitamin B-12: 474 pg/mL (ref 200–1100)

## 2022-01-05 ENCOUNTER — Telehealth: Payer: Self-pay | Admitting: Physician Assistant

## 2022-01-05 NOTE — Telephone Encounter (Signed)
Patients son would like a call  back to discuss some things. Mother tends to be "his words, normal" during week, but weekends she tends to be off or confused and fights sleep.

## 2022-01-06 NOTE — Telephone Encounter (Signed)
I called son, routing back to you for tomorrow call. Thanks.

## 2022-01-07 ENCOUNTER — Telehealth: Payer: Self-pay | Admitting: Physician Assistant

## 2022-01-07 NOTE — Telephone Encounter (Signed)
Left another voicemail to try to contact her son [name], no one answered.  He was instructed to call back

## 2022-01-07 NOTE — Telephone Encounter (Signed)
Spoke to the patient's son, [name] at 12:45 PM.  He reports that his mother during the week he is doing fairly well, she is more interactive.  He takes her to work, and her mood seems to be better controlled.  She sleeps better at night from Monday through Friday.  On weekends however, he reports that his mother does not sleep well, as that is not enough stimulation.  He is in the process of doing paperwork to attend adult day program at [facility].  The patient is only on Seroquel 50 mg daily, tolerating well. I recommended that the patient keep the same level of activity in a routine form during the weekend.  Especially, recommend activities that can stimulate her brain, in order for her to sleep better at night.  I also t recommended melatonin between 3 and 5 mg at night for sleep.  Patient was very appreciative of the phone call.  All questions have been answered. Time of call 13 minutes.

## 2022-01-07 NOTE — Telephone Encounter (Signed)
Called patient's son at 8:23 am, no answer, LVM to call back

## 2022-01-14 ENCOUNTER — Other Ambulatory Visit: Payer: Self-pay

## 2022-01-14 ENCOUNTER — Ambulatory Visit
Admission: RE | Admit: 2022-01-14 | Discharge: 2022-01-14 | Disposition: A | Discharge status: Home / Self Care | Payer: Medicare Other | Source: Ambulatory Visit | Attending: Physician Assistant | Admitting: Physician Assistant

## 2022-01-14 ENCOUNTER — Telehealth: Payer: Self-pay

## 2022-01-14 DIAGNOSIS — F02818 Dementia in other diseases classified elsewhere, unspecified severity, with other behavioral disturbance: Secondary | ICD-10-CM

## 2022-01-14 DIAGNOSIS — G309 Alzheimer's disease, unspecified: Secondary | ICD-10-CM

## 2022-01-14 IMAGING — MR MR HEAD W/O CM
11 series · 48 of 48 positions shown · non-contrast
Comparison: Head CT [DATE].
COMPARISON: Head CT [DATE].

Addendum:
CLINICAL DATA: Provided history: Major neuro cognitive disorder due
to LONIE disease, with behavioral disturbance. Memory loss.

EXAM:
MRI HEAD WITHOUT CONTRAST
TECHNIQUE: Multiplanar, multiecho pulse sequences of the brain and surrounding
structures were obtained without intravenous contrast.

[Series 2: T1 · sagittal · 5.0mm · 0.45mm/px · 1 of 21 slices shown]
[im 1/21]
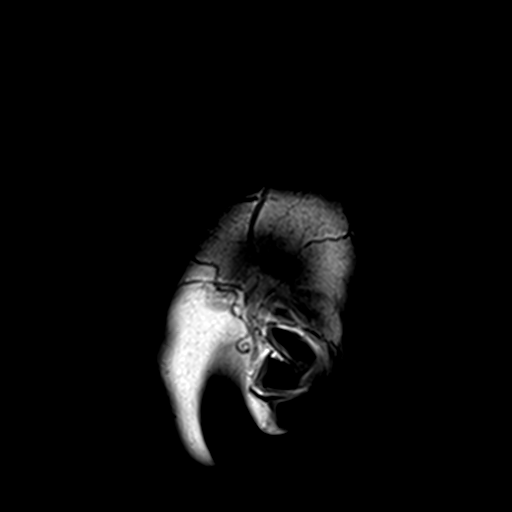

[Series 3: DWI · axial · 3.0mm · 1.80mm/px · z∈[-51,+92]mm · 8 of 97 slices shown (1 of 4)]
[im 1/97]
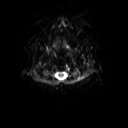
[im 14/97]
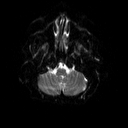
[im 28/97]
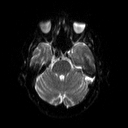
[im 42/97]
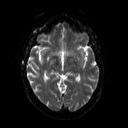
[im 55/97]
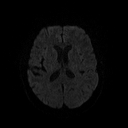
[im 69/97]
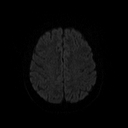
[im 83/97]
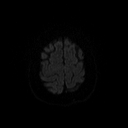
[im 97/97]
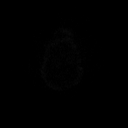

[Series 4: DWI · axial · 3.0mm · 1.80mm/px · z∈[-54,+92]mm · 4 of 48 slices shown (2 of 4)]
[im 1/48]
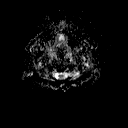
[im 16/48]
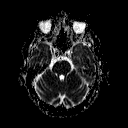
[im 32/48]
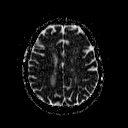
[im 48/48]
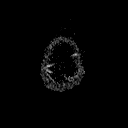

[Series 5: DWI · coronal · 5.0mm · 1.80mm/px · 6 of 66 slices shown (3 of 4)]
[im 1/66]
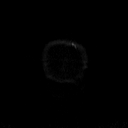
[im 14/66]
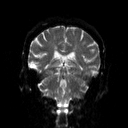
[im 27/66]
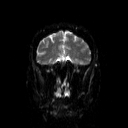
[im 40/66]
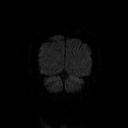
[im 53/66]
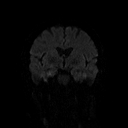
[im 66/66]
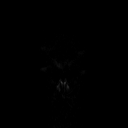

[Series 6: DWI · coronal · 5.0mm · 1.80mm/px · 3 of 34 slices shown (4 of 4)]
[im 1/34]
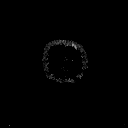
[im 17/34]
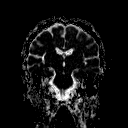
[im 34/34]
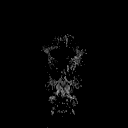

[Series 7: T2 · axial · 5.0mm · 0.51mm/px · z∈[-54,+92]mm · 2 of 22 slices shown (1 of 2)]
[im 1/22]
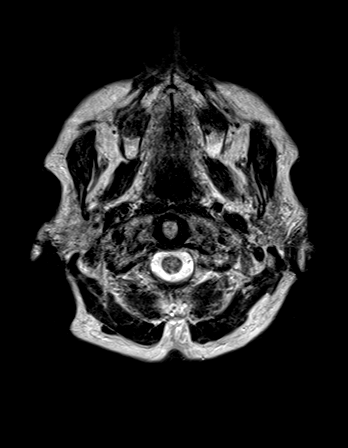
[im 22/22]
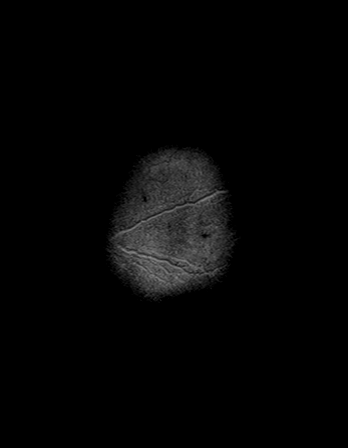

[Series 8: FLAIR · axial · 3.0mm · 0.45mm/px · z∈[-48,+86]mm · 3 of 30 slices shown]
[im 1/30]
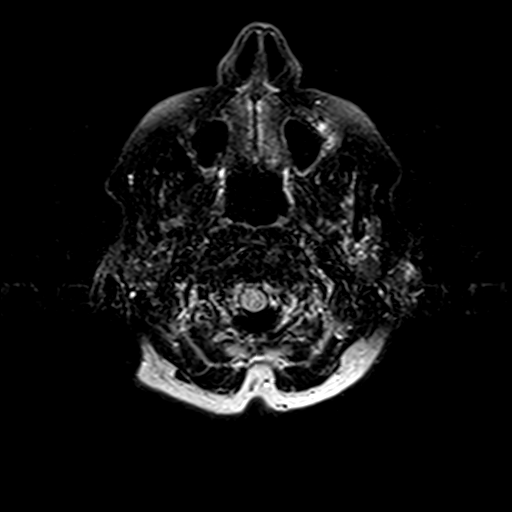
[im 15/30]
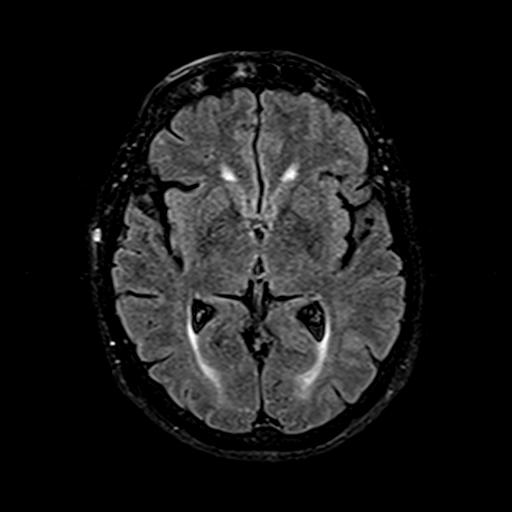
[im 30/30]
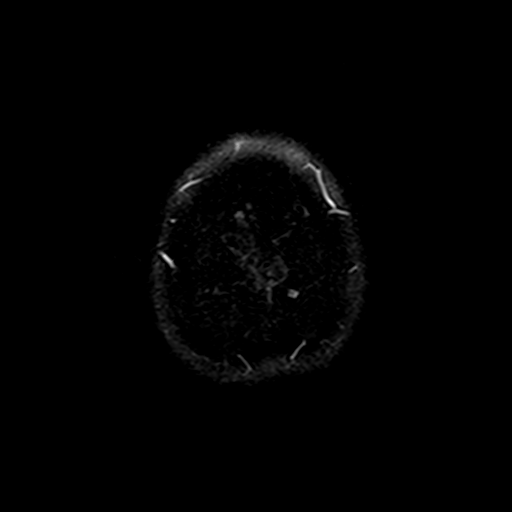

[Series 9: mip_images(sw) · axial · 32.0mm · 0.90mm/px · z∈[-36,+75]mm · 3 of 29 slices shown]
[im 1/29]
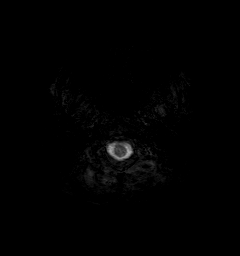
[im 15/29]
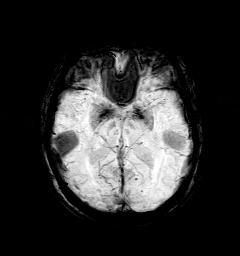
[im 29/29]
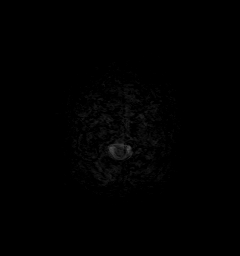

[Series 10: swi_images · axial · 4.0mm · 0.90mm/px · z∈[-50,+89]mm · 3 of 36 slices shown]
[im 1/36]
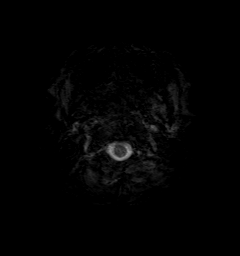
[im 18/36]
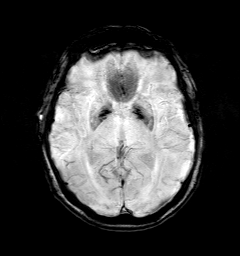
[im 36/36]
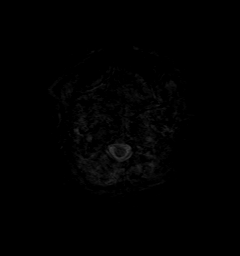

[Series 11: t1_mpr_tra · axial · 1.0mm · 0.71mm/px · z∈[-52,+90]mm · 13 of 144 slices shown]
[im 1/144]
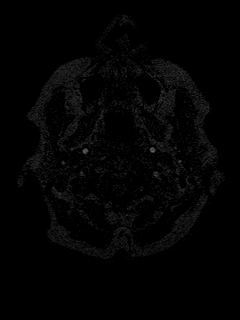
[im 12/144]
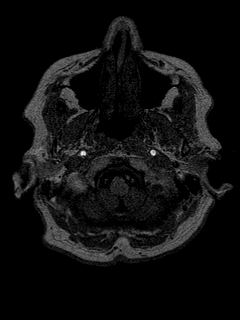
[im 24/144]
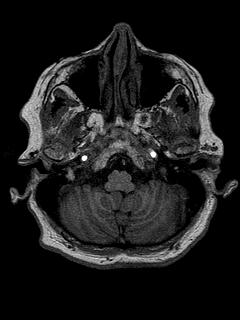
[im 36/144]
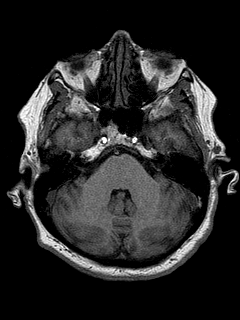
[im 48/144]
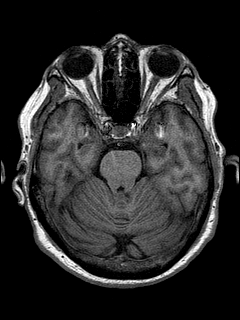
[im 60/144]
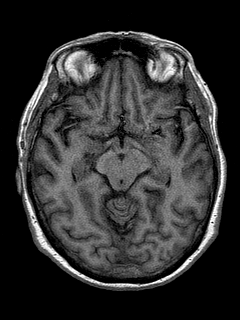
[im 72/144]
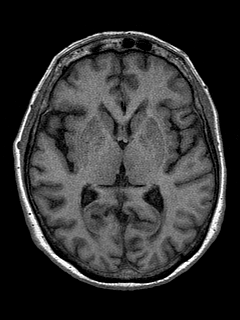
[im 84/144]
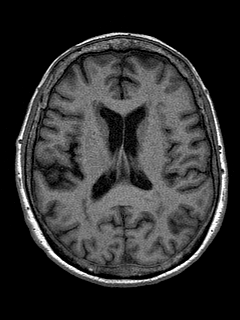
[im 96/144]
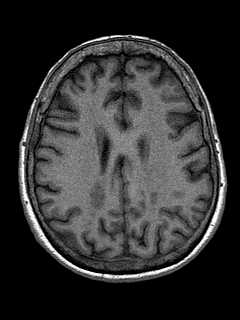
[im 108/144]
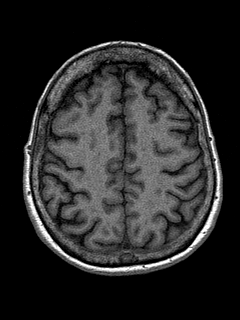
[im 120/144]
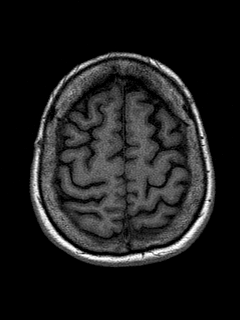
[im 132/144]
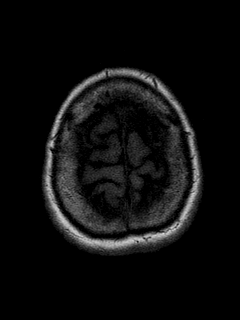
[im 144/144]
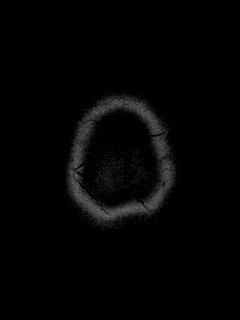

[Series 12: T2 · coronal · 5.0mm · 0.45mm/px · 2 of 25 slices shown (2 of 2)]
[im 1/25]
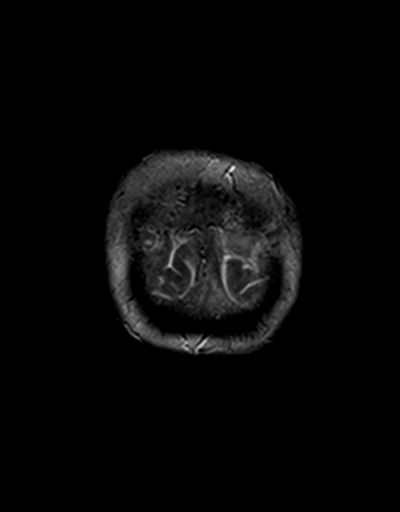
[im 25/25]
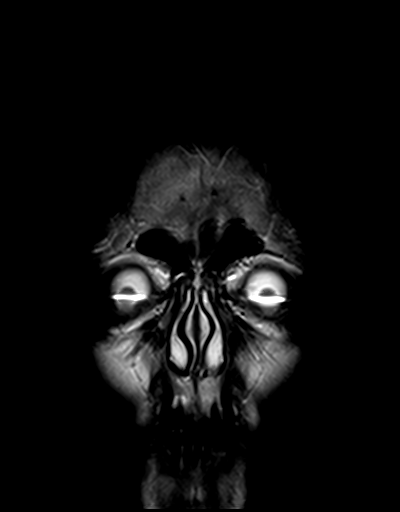

[48 of 48 positions shown; findings below may reference images not displayed]

FINDINGS: Brain:

The axial T2 TSE sequence is moderately motion degraded.

Mild cerebral atrophy without appreciable lobar predominance.

Sulcal T2 FLAIR hyperintense signal abnormality along the
anterolateral left frontal lobe (for instance as seen on series 8,
images 14-23).

Mild-to-moderate multifocal T2 FLAIR hyperintense signal abnormality
within the cerebral white matter, nonspecific but compatible with
chronic small vessel ischemic disease.

There is no acute infarct.

No evidence of an intracranial mass.

No midline shift.

Vascular: Maintained flow voids within the proximal large arterial
vessels.

Skull and upper cervical spine: No focal suspicious marrow lesion.
Susceptibility artifact arising from cervical spinal fusion
hardware.

Sinuses/Orbits: Visualized orbits show no acute finding. Minimal
mucosal thickening within the bilateral ethmoid sinuses.

Attempts are being made to reach the ordering provider at this time.
IMPRESSION: Sulcal T2 FLAIR hyperintense signal abnormality along the
anterolateral left frontal lobe. This is a nonspecific finding and
differential considerations include recent subarachnoid hemorrhage,
meningitis or leptomeningeal carcinomatosis, among others. A
non-contrast head CT is recommended to assess for any hyperdense
acute/subacute subarachnoid hemorrhage at this site. Post-contrast
MR imaging of the brain is also recommended for further evaluation.

Mild-to-moderate chronic small vessel ischemic changes within the
cerebral white matter.

Mild cerebral atrophy without appreciable lobar predominance.

ADDENDUM:
Impression #1 was called by telephone on [DATE] at [DATE] to
provider Dr. LONIE, who verbally acknowledged these results.

Additionally, please note there is a dictation error within the
CLINICAL DATA section of the report. This should read: Major
neurocognitive disorder due to Alzheimer's disease, with behavioral
disturbance.

*** End of Addendum ***
FINDINGS: Brain:

The axial T2 TSE sequence is moderately motion degraded.

Mild cerebral atrophy without appreciable lobar predominance.

Sulcal T2 FLAIR hyperintense signal abnormality along the
anterolateral left frontal lobe (for instance as seen on series 8,
images 14-23).

Mild-to-moderate multifocal T2 FLAIR hyperintense signal abnormality
within the cerebral white matter, nonspecific but compatible with
chronic small vessel ischemic disease.

There is no acute infarct.

No evidence of an intracranial mass.

No midline shift.

Vascular: Maintained flow voids within the proximal large arterial
vessels.

Skull and upper cervical spine: No focal suspicious marrow lesion.
Susceptibility artifact arising from cervical spinal fusion
hardware.

Sinuses/Orbits: Visualized orbits show no acute finding. Minimal
mucosal thickening within the bilateral ethmoid sinuses.

Attempts are being made to reach the ordering provider at this time.
IMPRESSION: Sulcal T2 FLAIR hyperintense signal abnormality along the
anterolateral left frontal lobe. This is a nonspecific finding and
differential considerations include recent subarachnoid hemorrhage,
meningitis or leptomeningeal carcinomatosis, among others. A
non-contrast head CT is recommended to assess for any hyperdense
acute/subacute subarachnoid hemorrhage at this site. Post-contrast
MR imaging of the brain is also recommended for further evaluation.

Mild-to-moderate chronic small vessel ischemic changes within the
cerebral white matter.

Mild cerebral atrophy without appreciable lobar predominance.

## 2022-01-14 NOTE — Telephone Encounter (Signed)
Dr Phillips Odor was given Dr Elson Clan number for an urgent report of on MRI

## 2022-01-15 NOTE — Progress Notes (Signed)
Would you please arrange CT head without contrast to rule out any underlying bleed and a follow up MRI with contrast to evaluate for meningeal abnormalities (meningitis or leptomeningeal carcinomatosis, among others)/. Would you please inform son about these plans, that, although they may not be affecting her memory, we need to follow up the brain MRI to make sure there are no other abnormalities, thanks !

## 2022-01-19 ENCOUNTER — Telehealth: Payer: Self-pay | Admitting: Physician Assistant

## 2022-01-19 NOTE — Telephone Encounter (Signed)
Tried to call line busy at [phone] 01/19/2022

## 2022-01-19 NOTE — Telephone Encounter (Signed)
Pt's son called in and left a message. He just recently got the message about his mother's results. He said our number had been coming up as spam, but he has gotten that fixed.

## 2022-01-20 NOTE — Telephone Encounter (Signed)
I left message to call office again

## 2022-01-21 NOTE — Telephone Encounter (Signed)
No answer this am at 8;36am 01/21/2022

## 2022-01-22 NOTE — Telephone Encounter (Signed)
Letter sent 02/24/2023d

## 2022-01-22 NOTE — Progress Notes (Signed)
I routed to provider to ask for further recommendations, since I am not able to get patient.

## 2022-01-22 NOTE — Telephone Encounter (Signed)
No answer again at 12/31/2021 at 10:05, multiple attempts routing to provider to ask for further recommendations.

## 2022-01-22 NOTE — Telephone Encounter (Signed)
Tried calling as well on 01/22/2022 at 12:35pm, no answer. Please send a letter to son [name] that we have been trying to call him several times since 01/19/2022 on telephone number listed [phone], and to call our office and leave the best number and time he can be reached. Thanks

## 2022-01-25 ENCOUNTER — Other Ambulatory Visit: Payer: Self-pay

## 2022-01-25 ENCOUNTER — Emergency Department (HOSPITAL_BASED_OUTPATIENT_CLINIC_OR_DEPARTMENT_OTHER)
Admission: EM | Admit: 2022-01-25 | Discharge: 2022-01-25 | Disposition: A | Discharge status: Home / Self Care | Payer: Medicare Other | Attending: Emergency Medicine | Admitting: Emergency Medicine

## 2022-01-25 ENCOUNTER — Emergency Department (HOSPITAL_BASED_OUTPATIENT_CLINIC_OR_DEPARTMENT_OTHER): Payer: Medicare Other

## 2022-01-25 ENCOUNTER — Encounter (HOSPITAL_BASED_OUTPATIENT_CLINIC_OR_DEPARTMENT_OTHER): Payer: Self-pay

## 2022-01-25 DIAGNOSIS — R4182 Altered mental status, unspecified: Secondary | ICD-10-CM | POA: Diagnosis present

## 2022-01-25 DIAGNOSIS — F0390 Unspecified dementia without behavioral disturbance: Secondary | ICD-10-CM | POA: Diagnosis not present

## 2022-01-25 DIAGNOSIS — R42 Dizziness and giddiness: Secondary | ICD-10-CM | POA: Insufficient documentation

## 2022-01-25 DIAGNOSIS — F02818 Dementia in other diseases classified elsewhere, unspecified severity, with other behavioral disturbance: Secondary | ICD-10-CM

## 2022-01-25 DIAGNOSIS — R9089 Other abnormal findings on diagnostic imaging of central nervous system: Secondary | ICD-10-CM

## 2022-01-25 DIAGNOSIS — R110 Nausea: Secondary | ICD-10-CM | POA: Insufficient documentation

## 2022-01-25 DIAGNOSIS — G309 Alzheimer's disease, unspecified: Secondary | ICD-10-CM

## 2022-01-25 LAB — URINALYSIS, ROUTINE W REFLEX MICROSCOPIC
Bilirubin Urine: NEGATIVE
Glucose, UA: NEGATIVE mg/dL
Hgb urine dipstick: NEGATIVE
Ketones, ur: NEGATIVE mg/dL
Nitrite: NEGATIVE
Protein, ur: NEGATIVE mg/dL
Specific Gravity, Urine: 1.015 (ref 1.005–1.030)
pH: 6.5 (ref 5.0–8.0)

## 2022-01-25 LAB — CBC
HCT: 40.2 % (ref 36.0–46.0)
Hemoglobin: 13.4 g/dL (ref 12.0–15.0)
MCH: 31.1 pg (ref 26.0–34.0)
MCHC: 33.3 g/dL (ref 30.0–36.0)
MCV: 93.3 fL (ref 80.0–100.0)
Platelets: 161 10*3/uL (ref 150–400)
RBC: 4.31 MIL/uL (ref 3.87–5.11)
RDW: 12.3 % (ref 11.5–15.5)
WBC: 4.5 10*3/uL (ref 4.0–10.5)
nRBC: 0 % (ref 0.0–0.2)

## 2022-01-25 LAB — DIFFERENTIAL
Abs Immature Granulocytes: 0.01 10*3/uL (ref 0.00–0.07)
Basophils Absolute: 0 10*3/uL (ref 0.0–0.1)
Basophils Relative: 1 %
Eosinophils Absolute: 0.1 10*3/uL (ref 0.0–0.5)
Eosinophils Relative: 3 %
Immature Granulocytes: 0 %
Lymphocytes Relative: 39 %
Lymphs Abs: 1.8 10*3/uL (ref 0.7–4.0)
Monocytes Absolute: 0.4 10*3/uL (ref 0.1–1.0)
Monocytes Relative: 8 %
Neutro Abs: 2.2 10*3/uL (ref 1.7–7.7)
Neutrophils Relative %: 49 %

## 2022-01-25 LAB — COMPREHENSIVE METABOLIC PANEL
ALT: 9 U/L (ref 0–44)
AST: 15 U/L (ref 15–41)
Albumin: 4 g/dL (ref 3.5–5.0)
Alkaline Phosphatase: 84 U/L (ref 38–126)
Anion gap: 8 (ref 5–15)
BUN: 12 mg/dL (ref 8–23)
CO2: 26 mmol/L (ref 22–32)
Calcium: 9.1 mg/dL (ref 8.9–10.3)
Chloride: 107 mmol/L (ref 98–111)
Creatinine, Ser: 0.69 mg/dL (ref 0.44–1.00)
GFR, Estimated: >60 mL/min (ref >60)
Glucose, Bld: 97 mg/dL (ref 70–99)
Potassium: 4 mmol/L (ref 3.5–5.1)
Sodium: 141 mmol/L (ref 135–145)
Total Bilirubin: 0.4 mg/dL (ref 0.3–1.2)
Total Protein: 6.8 g/dL (ref 6.5–8.1)

## 2022-01-25 LAB — CBG MONITORING, ED: Glucose-Capillary: 96 mg/dL (ref 70–99)

## 2022-01-25 LAB — PROTIME-INR
INR: 1 (ref 0.8–1.2)
Prothrombin Time: 12.9 seconds (ref 11.4–15.2)

## 2022-01-25 LAB — APTT: aPTT: 27 seconds (ref 24–36)

## 2022-01-25 IMAGING — CT CT HEAD W/O CM
4 series · 17 of 47 positions shown, 19 images · non-contrast
Comparison: None.

CLINICAL DATA: Dizziness



[Series 2: head wo · axial · 0.45mm/px · z∈[+986,+1106]mm · 7 of 32 slices shown, 9 images]
[im 4/32  brain]
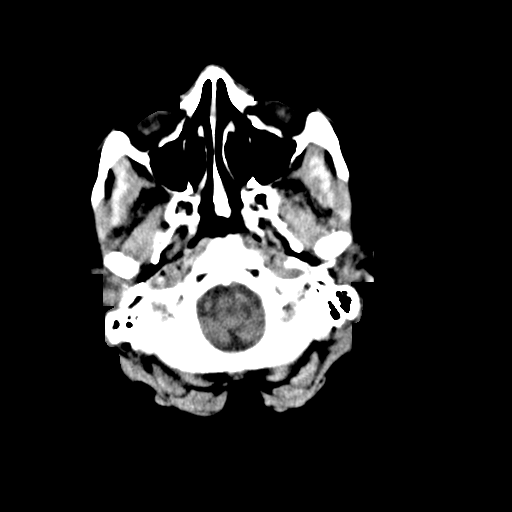
[im 4/32  bone]
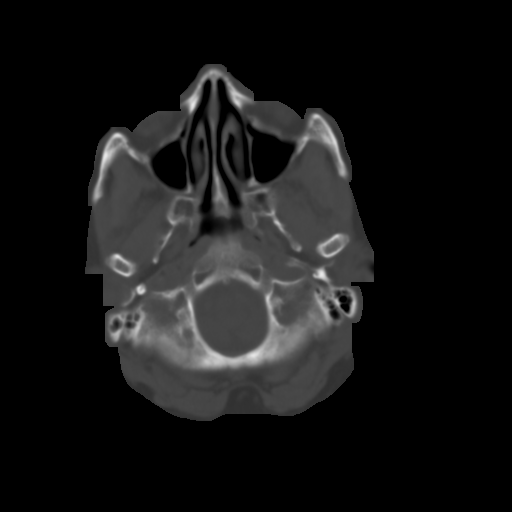
[im 8/32  brain]
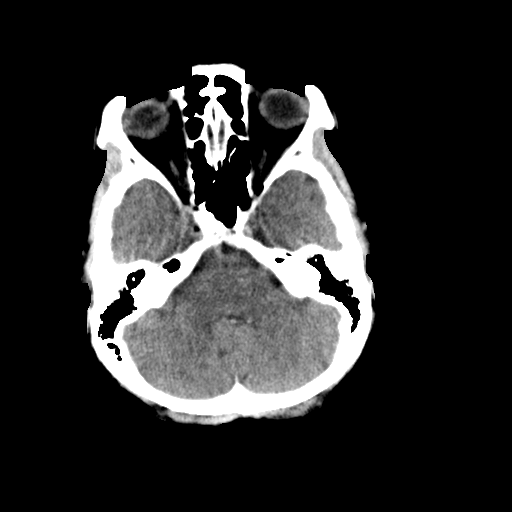
[im 12/32  brain]
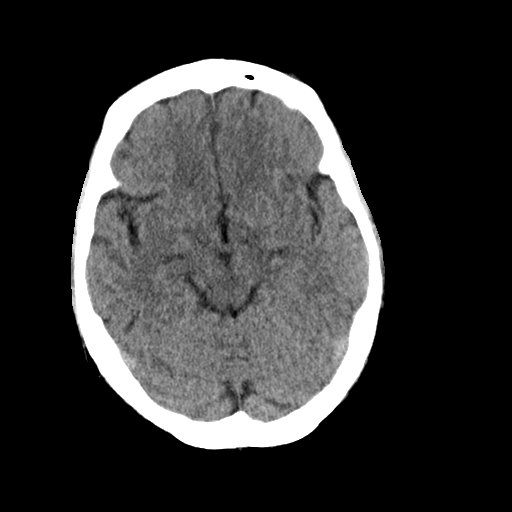
[im 16/32  brain]
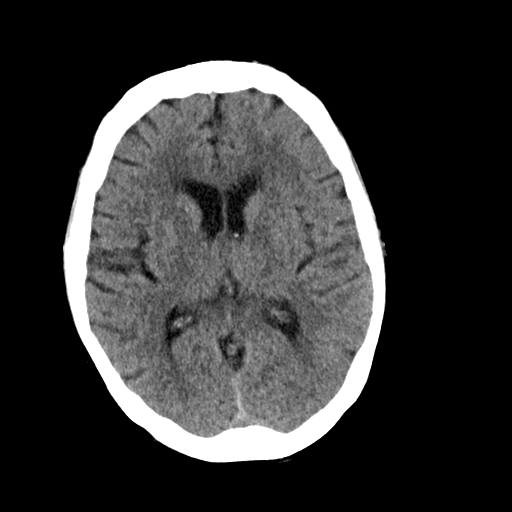
[im 20/32  brain]
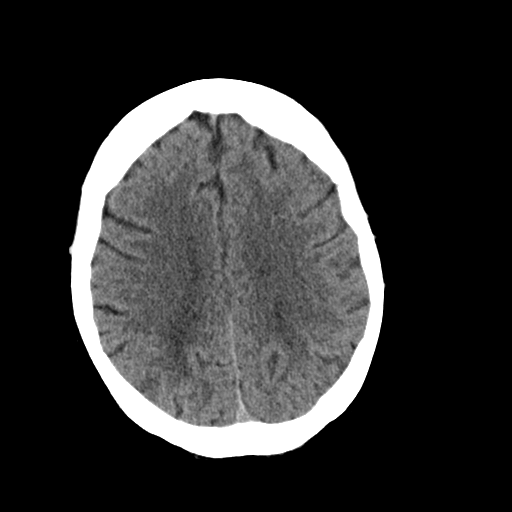
[im 20/32  bone]
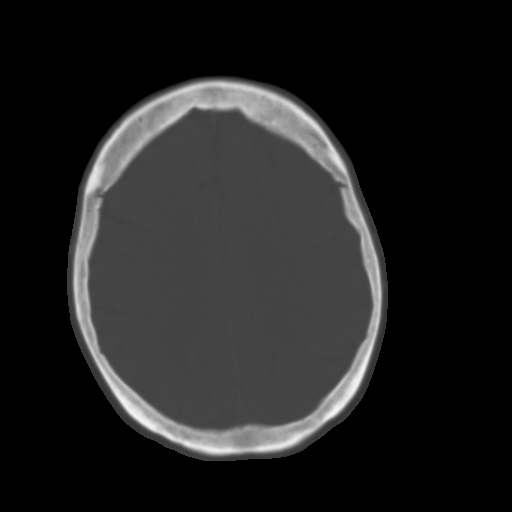
[im 24/32  brain]
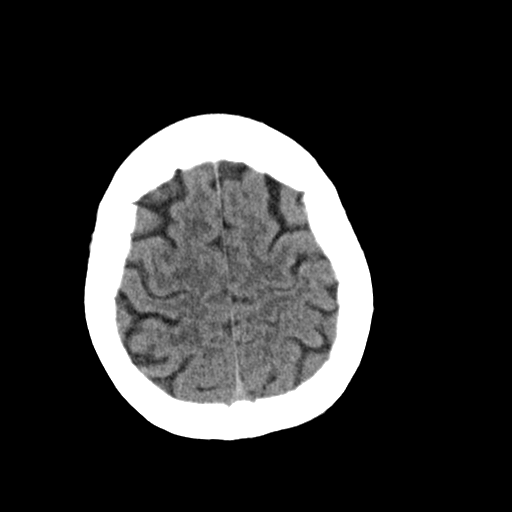
[im 28/32  brain]
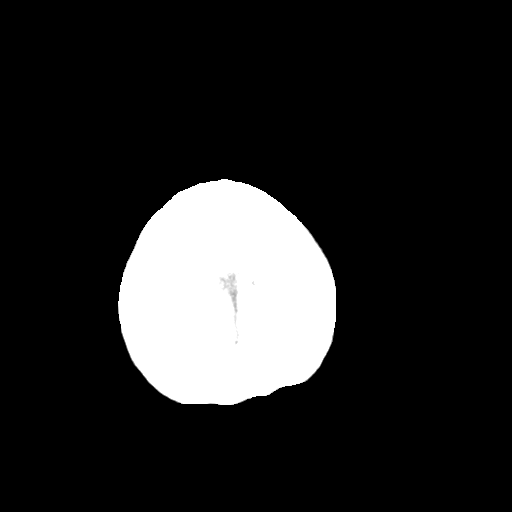

[Series 3: head bone · axial · 0.45mm/px · z∈[+985,+1041]mm · 4 of 80 slices shown]
[im 8/80  bone]
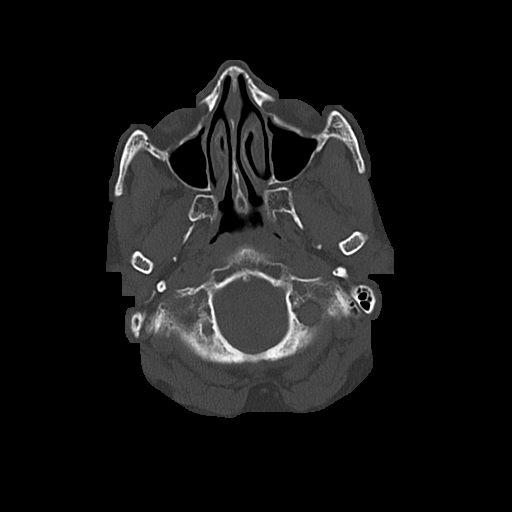
[im 16/80  bone]
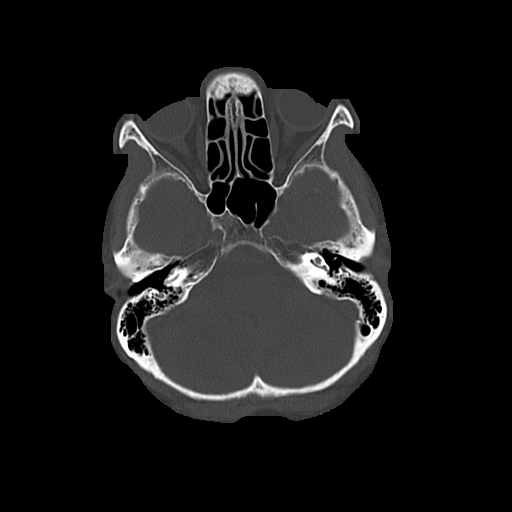
[im 24/80  bone]
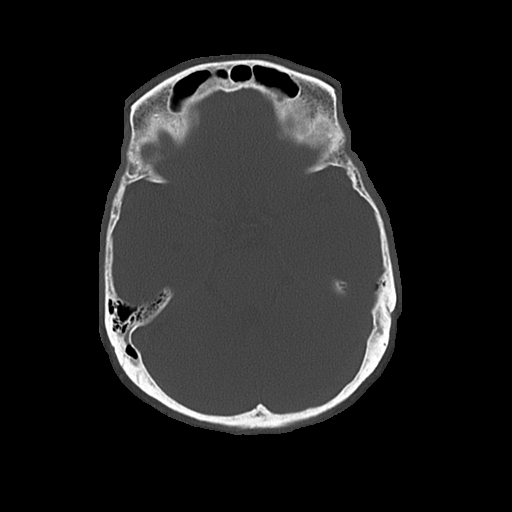
[im 36/80  bone]
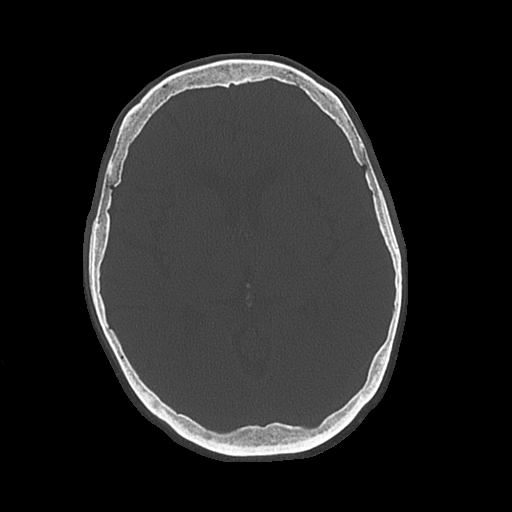

[Series 4: coronal soft · coronal · 0.32mm/px · 3 of 65 slices shown]
[im 22/65  brain]
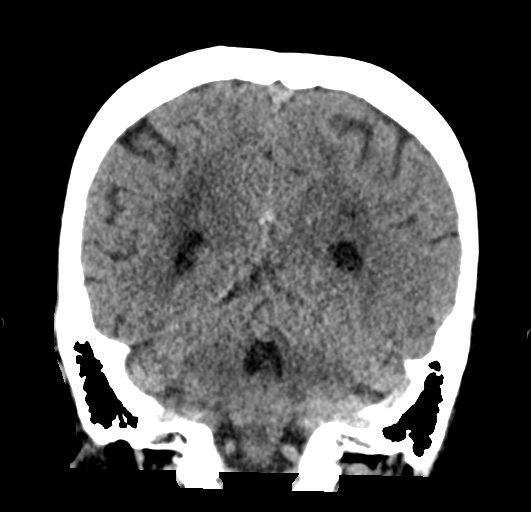
[im 29/65  brain]
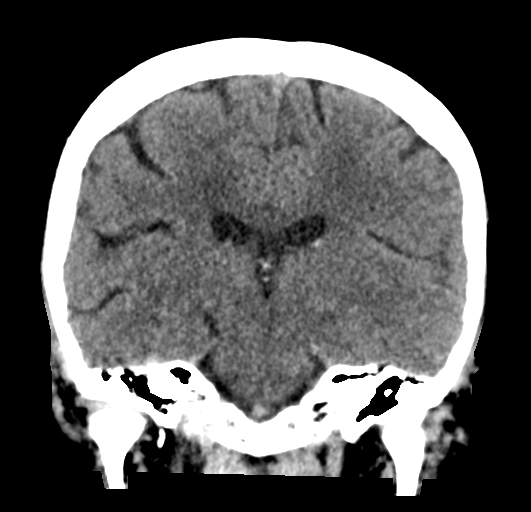
[im 36/65  brain]
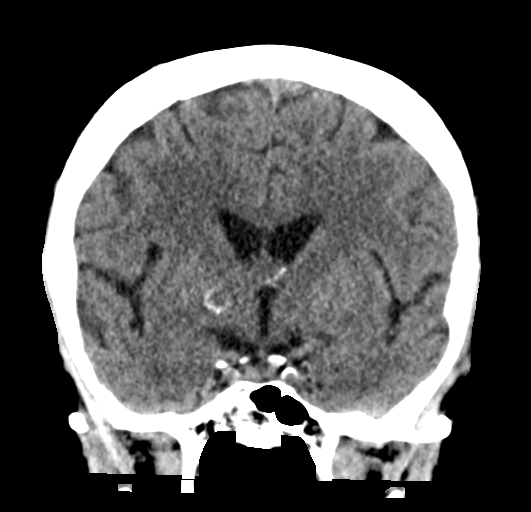

[Series 5: sagittal soft · sagittal · 0.32mm/px · 3 of 58 slices shown]
[im 20/58  brain]
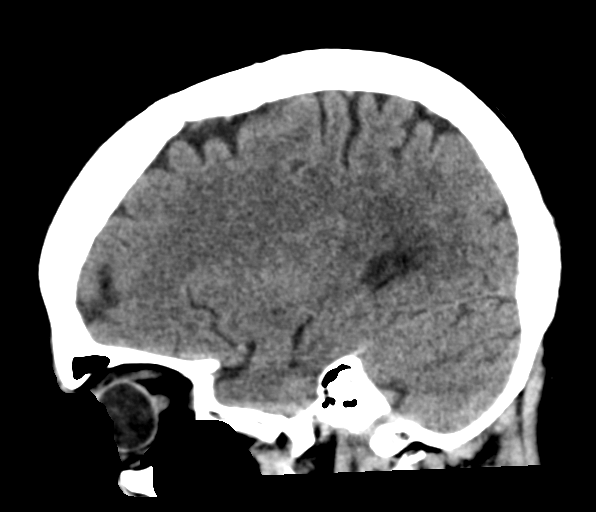
[im 29/58  brain]
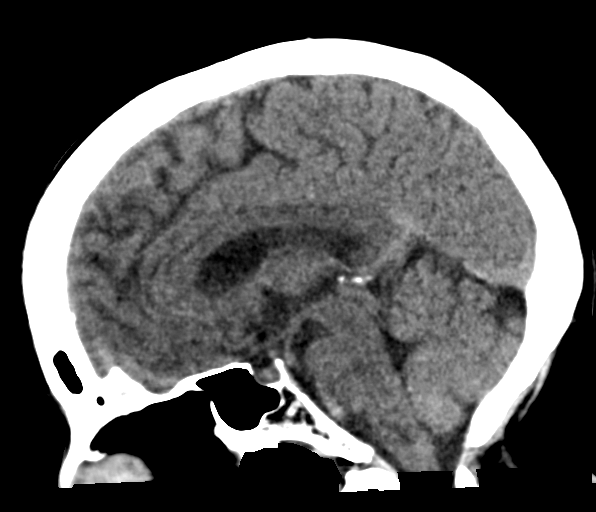
[im 39/58  brain]
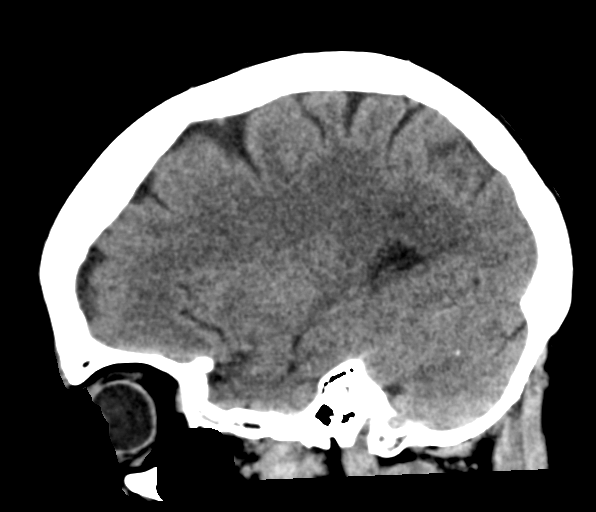

[17 of 47 positions shown; findings below may reference images not displayed]

FINDINGS: Brain: Normal anatomic configuration. Parenchymal volume loss is
commensurate with the patient's age. Mild periventricular white
matter changes are present likely reflecting the sequela of small
vessel ischemia. No abnormal intra or extra-axial mass lesion or
fluid collection. No abnormal mass effect or midline shift. No
evidence of acute intracranial hemorrhage or infarct. Ventricular
size is normal. Cerebellum unremarkable.

Vascular: No asymmetric hyperdense vasculature at the skull base.

Skull: Intact

Sinuses/Orbits: Paranasal sinuses are clear. Orbits are
unremarkable.

Other: Mastoid air cells and middle ear cavities are clear.
IMPRESSION: No acute abnormality.  Mild senescent change.

## 2022-01-25 IMAGING — DX DG CHEST 1V PORT
1 series · 1 of 1 positions shown · non-contrast
Comparison: None.

CLINICAL DATA: Nausea, dizziness

EXAM:
PORTABLE CHEST 1 VIEW

[chest ap]
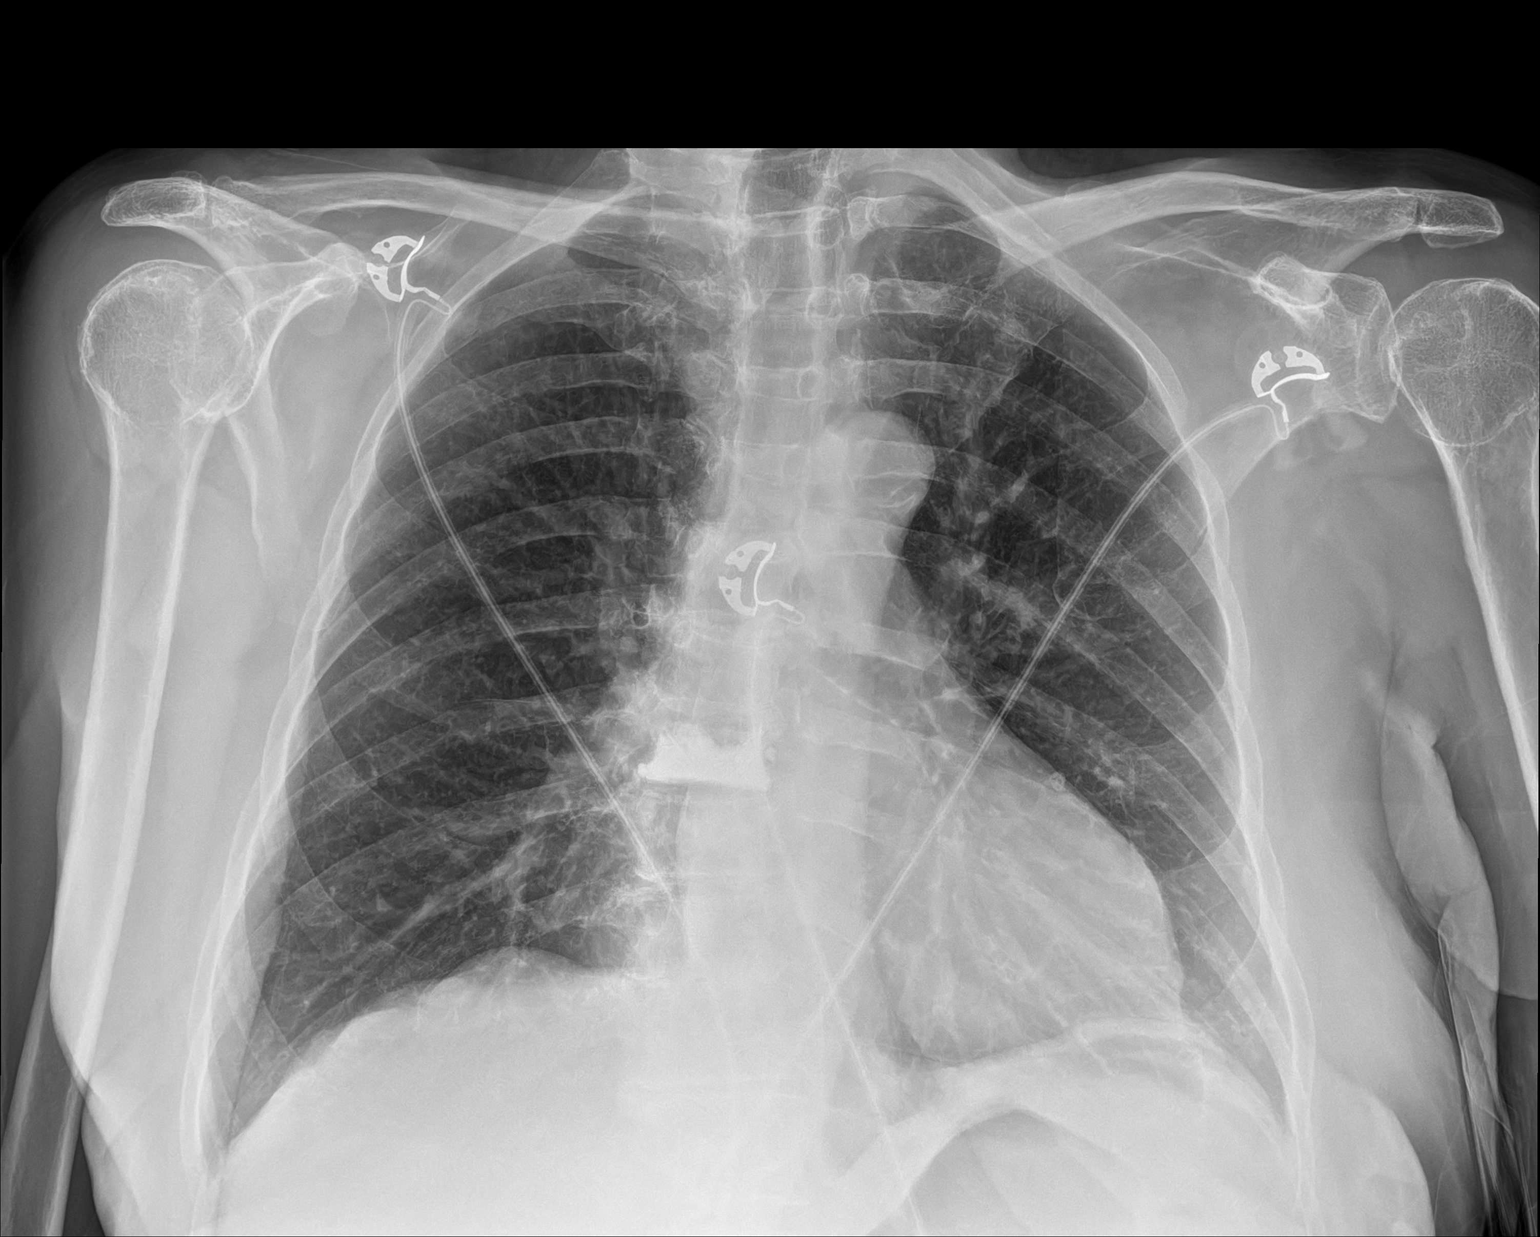

[1 of 1 positions shown; findings below may reference images not displayed]

FINDINGS: Heart and mediastinal contours are within normal limits. No focal
opacities or effusions. No acute bony abnormality. Prior thoracic
vertebroplasty.
IMPRESSION: No active cardiopulmonary disease.

## 2022-01-25 MED ORDER — SODIUM CHLORIDE 0.9% FLUSH
3.0000 mL | Freq: Once | INTRAVENOUS | Status: DC
  Filled 2022-01-25: qty 3

## 2022-01-25 NOTE — Progress Notes (Signed)
Tried calling again today at 3:20 01/25/2022, no answer

## 2022-01-25 NOTE — Progress Notes (Signed)
Spoke with son and contacted [facility] to get CT and MRI ordered. Number given to [facility] for son to contact them for appt status.

## 2022-01-25 NOTE — Discharge Instructions (Signed)
Your history, exam, work-up today were overall reassuring.  The urinalysis did not show convincing evidence of infection given your lack of symptoms.  A culture was sent.  Your other labs were similar to prior and reassuring.  The CT head did not show acute abnormality and you did not have evidence of pneumonia.  Please continue your outpatient work-up with neurology with the imaging tomorrow and rest and stay hydrated.  If any symptoms change or worsen acutely, please return to the nearest emergency department.

## 2022-01-25 NOTE — ED Triage Notes (Signed)
With waking up have nausea and dizziness.  Nausea new.  No vomiting. Being worked up by neurology for dizziness and head pain onset several weeks.  Was told by Neurology needed an CT scan and MRI.  Schedule for such tomorrow.

## 2022-01-25 NOTE — ED Provider Notes (Signed)
[facility] EMERGENCY DEPT Provider Note   CSN: [id number] Arrival date & time: 01/25/22  1800     History  Chief Complaint  Patient presents with   Nausea   Dizziness    Dawn Mills is a 71 y.o. female.  The history is provided by the patient and medical records. No language interpreter was used.  Dizziness Quality:  Unable to specify Severity:  Mild Onset quality:  Unable to specify Timing:  Unable to specify Progression:  Resolved Chronicity:  Chronic Relieved by:  Nothing Worsened by:  Nothing Ineffective treatments:  None tried Associated symptoms: nausea   Associated symptoms: no chest pain, no diarrhea, no headaches, no palpitations, no shortness of breath, no syncope, no vision changes, no vomiting and no weakness       Home Medications Prior to Admission medications   Medication Sig Start Date End Date Taking? Authorizing Provider  QUEtiapine (SEROQUEL) 50 MG tablet Take 1 tablet (50 mg total) by mouth at bedtime. 12/22/21 01/21/22  Wyvonnia Dusky, MD      Allergies    Patient has no known allergies.    Review of Systems   Review of Systems  Constitutional:  Positive for fatigue. Negative for chills, diaphoresis and fever.  HENT:  Negative for congestion.   Eyes:  Negative for visual disturbance.  Respiratory:  Negative for cough, chest tightness, shortness of breath and wheezing.   Cardiovascular:  Negative for chest pain, palpitations, leg swelling and syncope.  Gastrointestinal:  Positive for nausea. Negative for abdominal pain, constipation, diarrhea and vomiting.  Genitourinary:  Negative for dysuria and flank pain.  Musculoskeletal:  Negative for back pain, neck pain and neck stiffness.  Skin:  Negative for rash and wound.  Neurological:  Positive for dizziness and light-headedness. Negative for syncope, speech difficulty, weakness, numbness and headaches.  Psychiatric/Behavioral:  Negative for agitation and confusion.   All  other systems reviewed and are negative.  Physical Exam Updated Vital Signs BP (!) 138/104 (BP Location: Left Arm)    Pulse 77    Temp 98.5 F (36.9 C)    Resp 13    Ht 5\' 4"  (1.626 m)    Wt 60.8 kg    SpO2 100%    BMI 23.00 kg/m  Physical Exam Vitals and nursing note reviewed.  Constitutional:      General: She is not in acute distress.    Appearance: She is well-developed. She is not ill-appearing, toxic-appearing or diaphoretic.  HENT:     Head: Normocephalic and atraumatic.     Nose: No congestion or rhinorrhea.     Mouth/Throat:     Mouth: Mucous membranes are moist.     Pharynx: No oropharyngeal exudate or posterior oropharyngeal erythema.  Eyes:     Extraocular Movements: Extraocular movements intact.     Conjunctiva/sclera: Conjunctivae normal.     Pupils: Pupils are equal, round, and reactive to light.  Cardiovascular:     Rate and Rhythm: Normal rate and regular rhythm.     Heart sounds: No murmur heard. Pulmonary:     Effort: Pulmonary effort is normal. No respiratory distress.     Breath sounds: Normal breath sounds. No wheezing, rhonchi or rales.  Chest:     Chest wall: No tenderness.  Abdominal:     General: Abdomen is flat.     Palpations: Abdomen is soft.     Tenderness: There is no abdominal tenderness. There is no right CVA tenderness, left CVA tenderness, guarding or rebound.  Musculoskeletal:        General: No swelling or tenderness.     Cervical back: Neck supple. No tenderness.     Right lower leg: No edema.     Left lower leg: No edema.  Skin:    General: Skin is warm and dry.     Capillary Refill: Capillary refill takes less than 2 seconds.     Findings: No erythema.  Neurological:     General: No focal deficit present.     Mental Status: She is alert.     Sensory: No sensory deficit.     Motor: No weakness.  Psychiatric:        Mood and Affect: Mood normal.    ED Results / Procedures / Treatments   Labs (all labs ordered are listed, but  only abnormal results are displayed) Labs Reviewed  URINALYSIS, ROUTINE W REFLEX MICROSCOPIC - Abnormal; Notable for the following components:      Result Value   APPearance HAZY (*)    Leukocytes,Ua LARGE (*)    Bacteria, UA FEW (*)    All other components within normal limits  URINE CULTURE  PROTIME-INR  APTT  CBC  DIFFERENTIAL  COMPREHENSIVE METABOLIC PANEL  CBG MONITORING, ED    EKG EKG Interpretation  Date/Time:  Monday January 25 2022 18:40:37 EST Ventricular Rate:  72 PR Interval:  110 QRS Duration: 78 QT Interval:  392 QTC Calculation: 429 R Axis:   37 Text Interpretation: Sinus rhythm with short PR Possible Anterior infarct , age undetermined Abnormal ECG when compared to prior, similar appearance. No STEMI Confirmed by Antony Blackbird [id number]) on 01/25/2022 7:49:47 PM  Radiology CT HEAD WO CONTRAST  Result Date: 01/25/2022 CLINICAL DATA:  Dizziness EXAM: CT HEAD WITHOUT CONTRAST TECHNIQUE: Contiguous axial images were obtained from the base of the skull through the vertex without intravenous contrast. RADIATION DOSE REDUCTION: This exam was performed according to the departmental dose-optimization program which includes automated exposure control, adjustment of the mA and/or kV according to patient size and/or use of iterative reconstruction technique. COMPARISON:  None. FINDINGS: Brain: Normal anatomic configuration. Parenchymal volume loss is commensurate with the patient's age. Mild periventricular white matter changes are present likely reflecting the sequela of small vessel ischemia. No abnormal intra or extra-axial mass lesion or fluid collection. No abnormal mass effect or midline shift. No evidence of acute intracranial hemorrhage or infarct. Ventricular size is normal. Cerebellum unremarkable. Vascular: No asymmetric hyperdense vasculature at the skull base. Skull: Intact Sinuses/Orbits: Paranasal sinuses are clear. Orbits are unremarkable. Other: Mastoid air cells  and middle ear cavities are clear. IMPRESSION: No acute abnormality.  Mild senescent change. Electronically Signed   By: Fidela Salisbury M.D.   On: 01/25/2022 19:10    Procedures Procedures    Medications Ordered in ED Medications - No data to display  ED Course/ Medical Decision Making/ A&P                           Medical Decision Making Amount and/or Complexity of Data Reviewed Labs: ordered. Radiology: ordered.     Shaquinta Cheyne is a 71 y.o. female with a history of dementia and previous cholecystectomy who presents with some nausea and lightheadedness.  According to patient's son who is the primary caregiver, patient had progressive decline in her mental status and mobility for the last few months.  She is scheduled to have MRI and CT tomorrow to further evaluate her worsening mental  decline.  He reports that for the last day or 2 she has had some nausea and some dizziness with lightheadedness prompting them to come in to rule out other causes.  Otherwise she has denied fevers, chills, chest pain, shortness of breath, vomiting, constipation, diarrhea, or urinary changes.  No trauma or falls reported.  Patient has no complaints whatsoever during my initial evaluation.  On exam, lungs clear and chest nontender.  Abdomen nontender.  Patient has normal sensation and strength in extremities.  Normal finger-nose-finger testing.  Symmetric smile.  Clear speech.  Pupils symmetric and reactive normal extract movements.  Patient well-appearing with no complaints.  Had a shared decision made conversation with patient's family.  Given her nausea and fatigue it was reasonable to get a basic work-up including labs and work-up to look for occult infection with chest x-ray and urine.  Work-up overall reassuring.  Culture was sent on the urine given her lack of any symptoms, will wait for cultures before possible treatment of UTI.  CT head was reassuring.  No evidence of acute trauma.  They will  follow-up with neurology tomorrow and get the imaging as ordered by the outpatient neurology team.  No evidence of acute abnormalities that require intervention or admission at this time.  Patient and family agree.  Patient discharged in good condition.           Final Clinical Impression(s) / ED Diagnoses Final diagnoses:  Nausea  Dizziness    Rx / DC Orders ED Discharge Orders     None       Clinical Impression: 1. Nausea   2. Dizziness     Disposition: Discharge  Condition: Good  I have discussed the results, Dx and Tx plan with the pt(& family if present). He/she/they expressed understanding and agree(s) with the plan. Discharge instructions discussed at great length. Strict return precautions discussed and pt &/or family have verbalized understanding of the instructions. No further questions at time of discharge.    Discharge Medication List as of 01/25/2022 10:58 PM      Follow Up: go to your imaging visit tomorrow and follow up with neurology     Elza Rafter, [place] [address] [place] [address] [phone]        Courteney Alderete, Gwenyth Allegra, MD 01/26/22 [phone]

## 2022-01-25 NOTE — ED Notes (Signed)
Patient transported to CT 

## 2022-01-25 NOTE — Telephone Encounter (Signed)
Son called in and spoke with him, I sent order for CT and MRI to [facility], number given for him to call as  well.

## 2022-01-26 ENCOUNTER — Ambulatory Visit
Admission: RE | Admit: 2022-01-26 | Discharge: 2022-01-26 | Disposition: A | Discharge status: Home / Self Care | Payer: Medicare Other | Source: Ambulatory Visit | Attending: Physician Assistant | Admitting: Physician Assistant

## 2022-01-26 DIAGNOSIS — G309 Alzheimer's disease, unspecified: Secondary | ICD-10-CM

## 2022-01-26 DIAGNOSIS — F02818 Dementia in other diseases classified elsewhere, unspecified severity, with other behavioral disturbance: Secondary | ICD-10-CM

## 2022-01-28 LAB — URINE CULTURE: Culture: >=100000 — AB

## 2022-01-29 ENCOUNTER — Telehealth: Payer: Self-pay | Admitting: *Deleted

## 2022-01-29 NOTE — Progress Notes (Signed)
ED Antimicrobial Stewardship Positive Culture Follow Up   Dawn Mills is an 71 y.o. female who presented to [facility] on 01/26/2022 with a chief complaint of nausea, fatigue and dizziness  Recent Results (from the past 720 hour(s))  Urine Culture     Status: Abnormal   Collection Time: 01/25/22  9:05 PM   Specimen: Urine, Clean Catch  Result Value Ref Range Status   Specimen Description   Final    URINE, CLEAN CATCH Performed at [facility], [address] [place], [place] [address]    Special Requests   Final    NONE Performed at [facility], [address], [place], [place] [address]    Culture >=100,000 COLONIES/mL STAPHYLOCOCCUS EPIDERMIDIS (A)  Final   Report Status 01/28/2022 FINAL  Final   Organism ID, Bacteria STAPHYLOCOCCUS EPIDERMIDIS (A)  Final      Susceptibility   Staphylococcus epidermidis - MIC*    CIPROFLOXACIN <=0.5 SENSITIVE Sensitive     GENTAMICIN <=0.5 SENSITIVE Sensitive     NITROFURANTOIN <=16 SENSITIVE Sensitive     OXACILLIN >=4 RESISTANT Resistant     TETRACYCLINE 2 SENSITIVE Sensitive     VANCOMYCIN 1 SENSITIVE Sensitive     TRIMETH/SULFA <=10 SENSITIVE Sensitive     CLINDAMYCIN <=0.25 SENSITIVE Sensitive     RIFAMPIN <=0.5 SENSITIVE Sensitive     Inducible Clindamycin NEGATIVE Sensitive     * >=100,000 COLONIES/mL STAPHYLOCOCCUS EPIDERMIDIS    Please call patient for symptom check. If patient has developed any new symptoms such as urinary retention, pain with urination or fevers, please prescribe doxycycline 100 mg twice daily x 5 days. If no such symptoms are present, no antibiotics needed.    ED Provider: Dr. Jolly Mango, PharmD., BCCCP Clinical Pharmacist Please refer to [facility] for unit-specific pharmacist

## 2022-01-29 NOTE — Telephone Encounter (Signed)
Post ED Visit - Positive Culture Follow-up: Unsuccessful Patient Follow-up ? ?Culture assessed and recommendations reviewed by: ? ?[]  , Pharm.D. ?[]  Enzo Bi, Pharm.D., BCPS AQ-ID ?[]  , Pharm.D., BCPS ?[]  Celedonio Miyamoto, Pharm.D., BCPS ?[]  [address], Garvin Fila.D., BCPS, AAHIVP ?[]  , Pharm.D., BCPS, AAHIVP ?[]  Georgina Pillion, PharmD ?[]  , PharmD, BCPS ? ?Positive urine culture ? ?[x]  Patient discharged without antimicrobial prescription and treatment is now indicated ?[]  Organism is resistant to prescribed ED discharge antimicrobial ?[]  Patient with positive blood cultures ? ?Plan:  Symptom check, if symptomatic start Doxycycline 100mg  bid x 5 days, [place], MD. ? ?Unable to contact patient after 3 attempts, letter will be sent to address on file ? ?[address] ?01/29/2022, 12:07 PM  ?

## 2022-02-04 ENCOUNTER — Telehealth: Payer: Self-pay

## 2022-02-04 NOTE — Telephone Encounter (Signed)
Patient cancelled MRI brain w wo contrast, fyi per [facility].  ?

## 2022-02-18 NOTE — Progress Notes (Signed)
Coagulation studies were ordered as the patient was running with dizziness, nausea, and unsteadiness troubles and has we were concerned about intracranial normalities such as bleeding or stroke, this potentially could change management if they were abnormal.

## 2022-02-24 ENCOUNTER — Other Ambulatory Visit: Payer: Self-pay

## 2022-02-24 ENCOUNTER — Emergency Department (HOSPITAL_BASED_OUTPATIENT_CLINIC_OR_DEPARTMENT_OTHER)
Admission: EM | Admit: 2022-02-24 | Discharge: 2022-02-24 | Disposition: A | Discharge status: Home / Self Care | Payer: Medicare Other | Attending: Emergency Medicine | Admitting: Emergency Medicine

## 2022-02-24 ENCOUNTER — Emergency Department (HOSPITAL_BASED_OUTPATIENT_CLINIC_OR_DEPARTMENT_OTHER): Payer: Medicare Other | Admitting: Radiology

## 2022-02-24 ENCOUNTER — Encounter (HOSPITAL_BASED_OUTPATIENT_CLINIC_OR_DEPARTMENT_OTHER): Payer: Self-pay | Admitting: *Deleted

## 2022-02-24 DIAGNOSIS — S20212A Contusion of left front wall of thorax, initial encounter: Secondary | ICD-10-CM | POA: Insufficient documentation

## 2022-02-24 DIAGNOSIS — W19XXXA Unspecified fall, initial encounter: Secondary | ICD-10-CM | POA: Diagnosis not present

## 2022-02-24 DIAGNOSIS — F0390 Unspecified dementia without behavioral disturbance: Secondary | ICD-10-CM | POA: Insufficient documentation

## 2022-02-24 DIAGNOSIS — S299XXA Unspecified injury of thorax, initial encounter: Secondary | ICD-10-CM | POA: Diagnosis present

## 2022-02-24 LAB — BASIC METABOLIC PANEL
Anion gap: 10 (ref 5–15)
BUN: 14 mg/dL (ref 8–23)
CO2: 25 mmol/L (ref 22–32)
Calcium: 8.8 mg/dL — ABNORMAL LOW (ref 8.9–10.3)
Chloride: 107 mmol/L (ref 98–111)
Creatinine, Ser: 0.76 mg/dL (ref 0.44–1.00)
GFR, Estimated: >60 mL/min (ref >60)
Glucose, Bld: 126 mg/dL — ABNORMAL HIGH (ref 70–99)
Potassium: 3.5 mmol/L (ref 3.5–5.1)
Sodium: 142 mmol/L (ref 135–145)

## 2022-02-24 LAB — CBC
HCT: 38.2 % (ref 36.0–46.0)
Hemoglobin: 13 g/dL (ref 12.0–15.0)
MCH: 31.4 pg (ref 26.0–34.0)
MCHC: 34 g/dL (ref 30.0–36.0)
MCV: 92.3 fL (ref 80.0–100.0)
Platelets: 151 10*3/uL (ref 150–400)
RBC: 4.14 MIL/uL (ref 3.87–5.11)
RDW: 12.4 % (ref 11.5–15.5)
WBC: 4.7 10*3/uL (ref 4.0–10.5)
nRBC: 0 % (ref 0.0–0.2)

## 2022-02-24 LAB — TROPONIN I (HIGH SENSITIVITY)
Troponin I (High Sensitivity): 4 ng/L (ref <18)
Troponin I (High Sensitivity): 4 ng/L (ref <18)

## 2022-02-24 IMAGING — DX DG CHEST 2V
2 series · 2 of 2 positions shown · non-contrast
Comparison: Chest radiograph dated [DATE].

CLINICAL DATA: Chest pain.

EXAM:
CHEST - 2 VIEW

[chest pa]
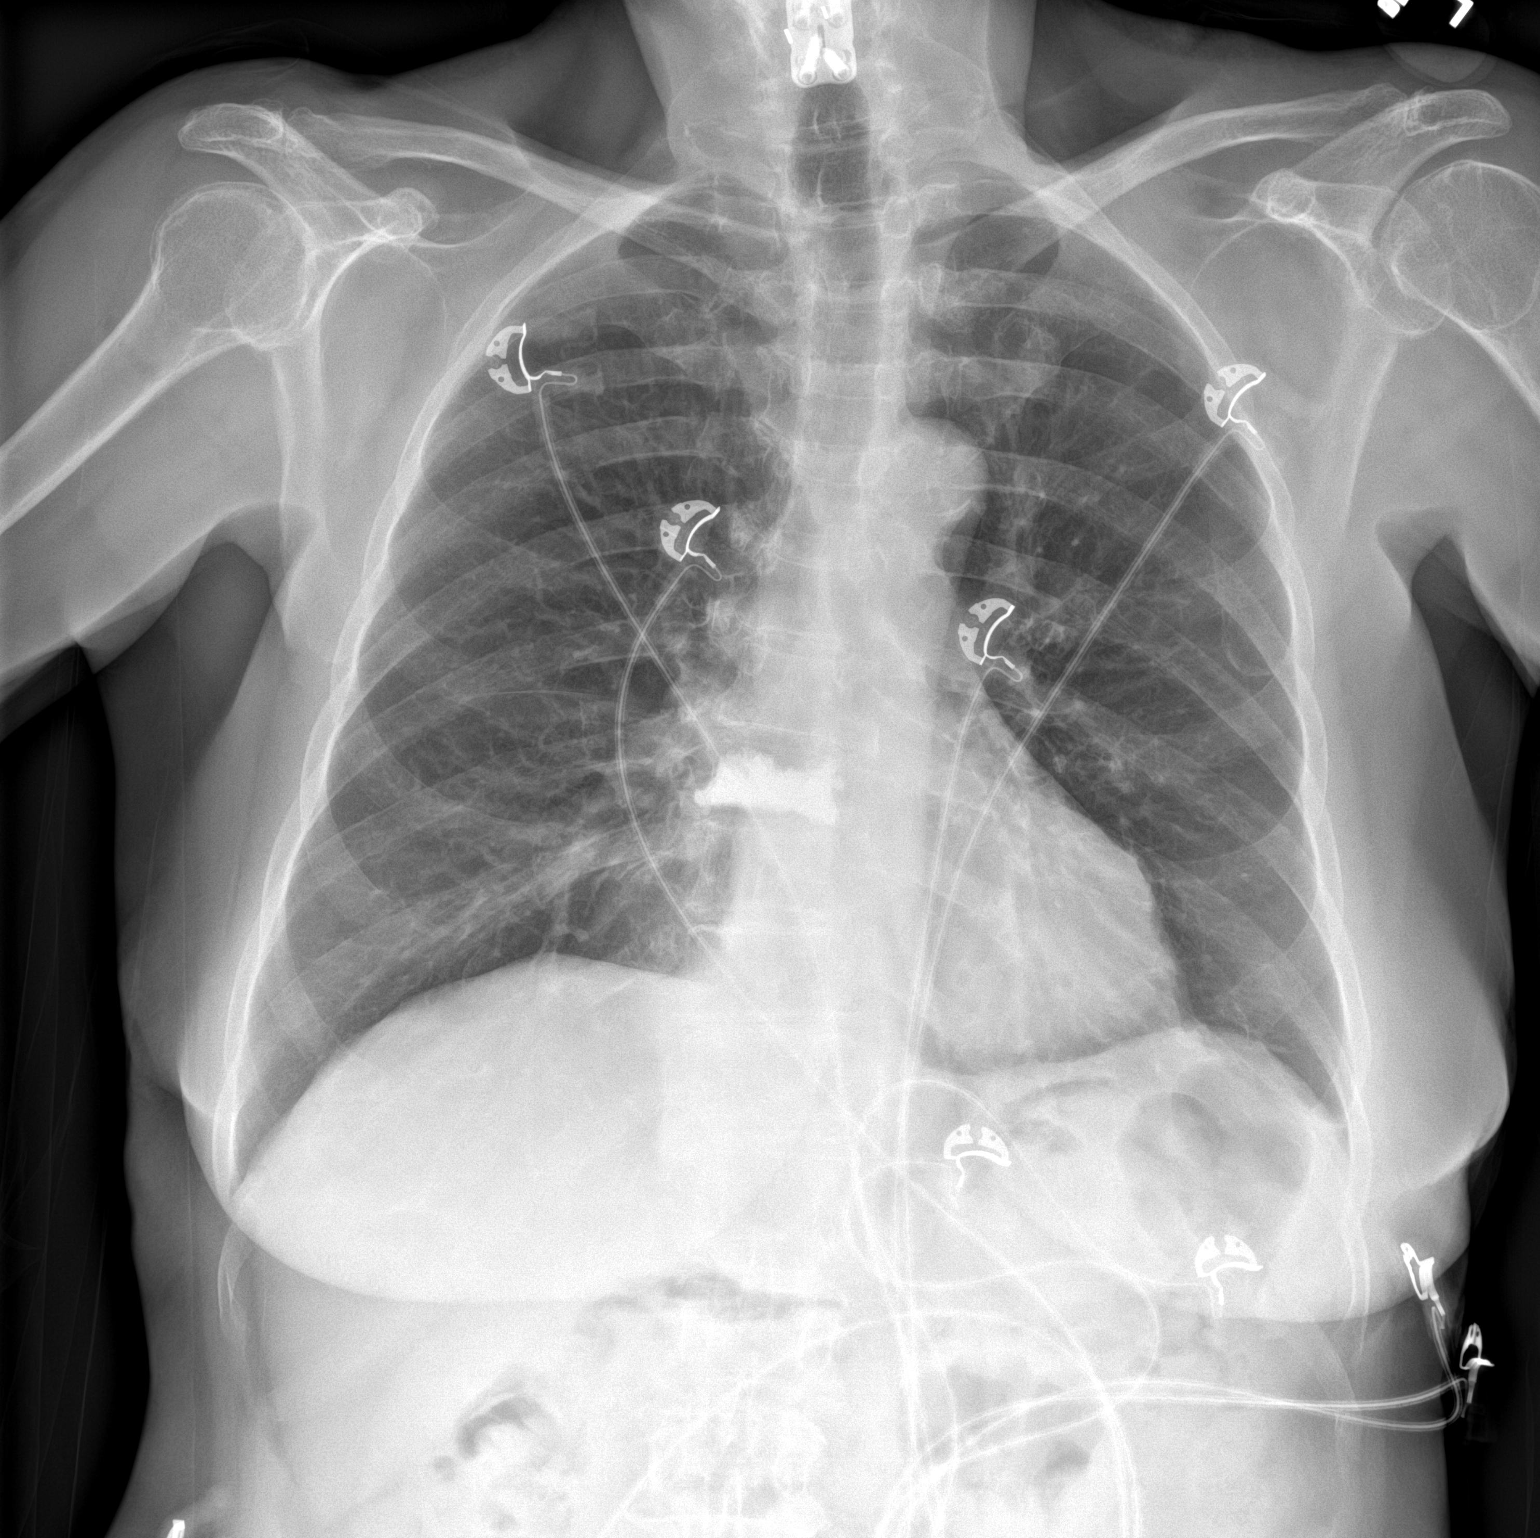

[chest lat]
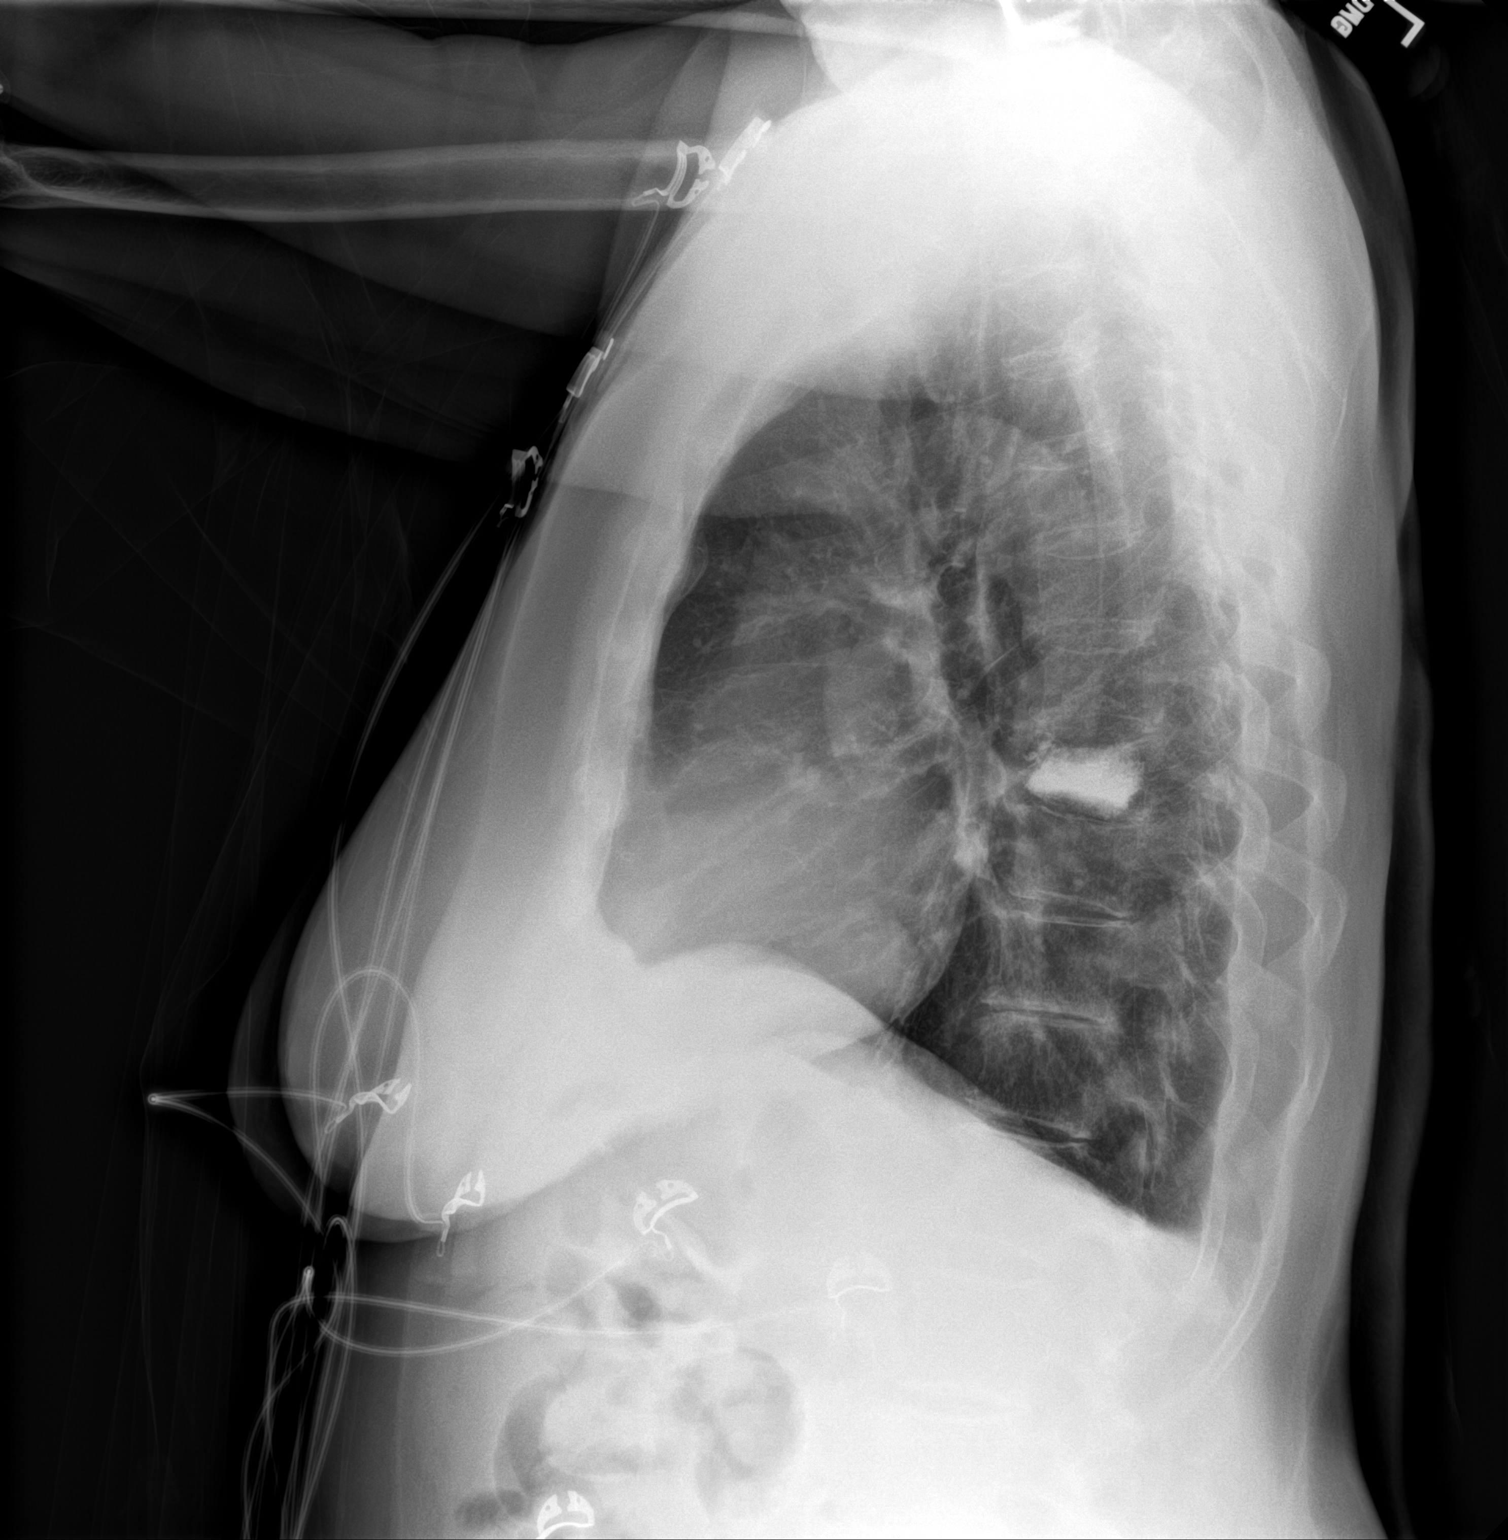

[2 of 2 positions shown; findings below may reference images not displayed]

FINDINGS: No focal consolidation, pleural effusion or pneumothorax. The
cardiac silhouette is within limits. Atherosclerotic calcification
of the aorta. Osteopenia with degenerative changes of the spine.
Midthoracic vertebroplasty. Cervical spine ACDF. No acute osseous
pathology.
IMPRESSION: No active cardiopulmonary disease.

## 2022-02-24 NOTE — ED Provider Notes (Signed)
?[facility] EMERGENCY DEPT ?Provider Note ? ? ?CSN: [id number] ?Arrival date & time: 02/24/22  0115 ? ?  ? ?History ? ?Chief Complaint  ?Patient presents with  ? Chest Pain  ? ? ?Dawn Mills is a 71 y.o. female. ? ?The history is provided by the patient and a caregiver. The history is limited by the condition of the patient (Dementia).  ?Chest Pain ?She has history of dementia and comes in because of an episode of chest pain.  She had fallen 3 days ago, landing on her left side but had not complained of anything.  Tonight, she complained to her son that she was having left-sided chest pain.  Pain has resolved now.  There is no associated dyspnea, nausea, diaphoresis.  She is unable to describe the pain.  There is no history of tobacco abuse and no history of hypertension or diabetes or hyperlipidemia and there is no family history of premature coronary atherosclerosis. ?  ?Home Medications ?Prior to Admission medications   ?Medication Sig Start Date End Date Taking? Authorizing Provider  ?QUEtiapine (SEROQUEL) 50 MG tablet Take 1 tablet (50 mg total) by mouth at bedtime. 12/22/21 01/21/22  Terald Sleeper, MD  ?   ? ?Allergies    ?Patient has no known allergies.   ? ?Review of Systems   ?Review of Systems  ?Unable to perform ROS: Dementia  ?Cardiovascular:  Positive for chest pain.  ? ?Physical Exam ?Updated Vital Signs ?BP 137/65   Pulse 63   Temp 98.4 ?F (36.9 ?C)   Resp 20   Ht 5\' 4"  (1.626 m)   Wt 56.7 kg   SpO2 96%   BMI 21.46 kg/m?  ?Physical Exam ?Vitals and nursing note reviewed.  ?71 year old female, resting comfortably and in no acute distress. Vital signs are normal. Oxygen saturation is 96%, which is normal. ?Head is normocephalic and atraumatic. PERRLA, EOMI. Oropharynx is clear. ?Neck is nontender and supple without adenopathy or JVD. ?Back is nontender and there is no CVA tenderness. ?Lungs are clear without rales, wheezes, or rhonchi. ?Chest is moderately tender over the left  anterior chest wall.  There is no crepitus. ?Heart has regular rate and rhythm without murmur. ?Abdomen is soft, flat, nontender without masses or hepatosplenomegaly and peristalsis is normoactive. ?Extremities have no cyanosis or edema, full range of motion is present. ?Skin is warm and dry without rash. ?Neurologic: Awake and alert, cranial nerves are intact, moves all extremities equally. ? ?ED Results / Procedures / Treatments   ?Labs ?(all labs ordered are listed, but only abnormal results are displayed) ?Labs Reviewed  ?BASIC METABOLIC PANEL - Abnormal; Notable for the following components:  ?    Result Value  ? Glucose, Bld 126 (*)   ? Calcium 8.8 (*)   ? All other components within normal limits  ?CBC  ?TROPONIN I (HIGH SENSITIVITY)  ?TROPONIN I (HIGH SENSITIVITY)  ? ? ?EKG ?EKG Interpretation ? ?Date/Time:  Wednesday February 24 2022 01:28:19 EDT ?Ventricular Rate:  74 ?PR Interval:  142 ?QRS Duration: 89 ?QT Interval:  382 ?QTC Calculation: 424 ?R Axis:   74 ?Text Interpretation: Sinus rhythm Atrial premature complex Low voltage, precordial leads When compared with ECG of 01/25/2022, Premature atrial complexes are now present Confirmed by 01/27/2022 (Dione Booze) on 02/24/2022 3:08:25 AM ? ?Radiology ?DG Chest 2 View ? ?Result Date: 02/24/2022 ?CLINICAL DATA:  Chest pain. EXAM: CHEST - 2 VIEW COMPARISON:  Chest radiograph dated 01/25/2022. FINDINGS: No focal consolidation, pleural effusion or  pneumothorax. The cardiac silhouette is within limits. Atherosclerotic calcification of the aorta. Osteopenia with degenerative changes of the spine. Midthoracic vertebroplasty. Cervical spine ACDF. No acute osseous pathology. IMPRESSION: No active cardiopulmonary disease. Electronically Signed   By: Elgie Collard M.D.   On: 02/24/2022 01:58   ? ?Procedures ?Procedures  ?Cardiac monitor shows normal sinus rhythm, per my interpretation. ? ?Medications Ordered in ED ?Medications - No data to display ? ?ED Course/ Medical  Decision Making/ A&P ?  ?                        ?Medical Decision Making ?Amount and/or Complexity of Data Reviewed ?Labs: ordered. ?Radiology: ordered. ? ? ?Chest pain in patient with dementia is not able to give important historical information.  However, she clearly shows evidence of chest wall tenderness and had a recent fall and I suspect that is the cause of her pain.  ECG shows no acute changes.  Chest x-ray shows no acute cardiopulmonary process.  I have independently viewed the images, and agree with radiologist interpretation.  Labs are unremarkable including initial troponin of 4, second troponin pending.  Heart score is 2 which puts her at low risk for major adverse cardiac events in the next 6 weeks.  If repeat troponin is normal, will treat as chest wall contusion. ? ?Repeat troponin is unchanged.  She is discharged with instructions to apply ice as needed, take over-the-counter analgesics as needed. ? ?Final Clinical Impression(s) / ED Diagnoses ?Final diagnoses:  ?Chest wall contusion, left, initial encounter  ? ? ?Rx / DC Orders ?ED Discharge Orders   ? ? None  ? ?  ? ? ?  ?Dione Booze, MD ?02/24/22 [id number] ? ?

## 2022-02-24 NOTE — ED Notes (Signed)
Patient transported to X-ray 

## 2022-02-24 NOTE — Discharge Instructions (Signed)
Apply ice for 30 minutes at a time, 4 times a day. ? ?Take ibuprofen or naproxen as needed for pain.  For additional pain relief, add acetaminophen.  Please note that if you combine ibuprofen or naproxen with acetaminophen, you get better pain relief then by taking either medication by itself. ?

## 2022-02-24 NOTE — ED Triage Notes (Addendum)
Pt says that she has had some pain in the left chest tonight after work, also having dizziness. Pt did have a fall on Saturday evening. Per son, pt is to have an MRI at some point for her dizziness.  ?

## 2022-02-25 ENCOUNTER — Other Ambulatory Visit: Payer: Self-pay | Admitting: Physician Assistant

## 2022-02-25 DIAGNOSIS — R9089 Other abnormal findings on diagnostic imaging of central nervous system: Secondary | ICD-10-CM

## 2022-03-15 ENCOUNTER — Emergency Department (HOSPITAL_BASED_OUTPATIENT_CLINIC_OR_DEPARTMENT_OTHER): Payer: Medicare Other | Admitting: Radiology

## 2022-03-15 ENCOUNTER — Encounter (HOSPITAL_BASED_OUTPATIENT_CLINIC_OR_DEPARTMENT_OTHER): Payer: Self-pay

## 2022-03-15 ENCOUNTER — Other Ambulatory Visit: Payer: Self-pay

## 2022-03-15 DIAGNOSIS — R0602 Shortness of breath: Secondary | ICD-10-CM | POA: Diagnosis not present

## 2022-03-15 DIAGNOSIS — W19XXXA Unspecified fall, initial encounter: Secondary | ICD-10-CM | POA: Insufficient documentation

## 2022-03-15 DIAGNOSIS — R42 Dizziness and giddiness: Secondary | ICD-10-CM | POA: Insufficient documentation

## 2022-03-15 DIAGNOSIS — S20212A Contusion of left front wall of thorax, initial encounter: Secondary | ICD-10-CM | POA: Diagnosis not present

## 2022-03-15 DIAGNOSIS — S299XXA Unspecified injury of thorax, initial encounter: Secondary | ICD-10-CM | POA: Diagnosis present

## 2022-03-15 LAB — BASIC METABOLIC PANEL
Anion gap: 7 (ref 5–15)
BUN: 15 mg/dL (ref 8–23)
CO2: 29 mmol/L (ref 22–32)
Calcium: 9.6 mg/dL (ref 8.9–10.3)
Chloride: 106 mmol/L (ref 98–111)
Creatinine, Ser: 1.04 mg/dL — ABNORMAL HIGH (ref 0.44–1.00)
GFR, Estimated: 58 mL/min — ABNORMAL LOW (ref >60)
Glucose, Bld: 100 mg/dL — ABNORMAL HIGH (ref 70–99)
Potassium: 3.8 mmol/L (ref 3.5–5.1)
Sodium: 142 mmol/L (ref 135–145)

## 2022-03-15 LAB — TROPONIN I (HIGH SENSITIVITY): Troponin I (High Sensitivity): 3 ng/L (ref <18)

## 2022-03-15 LAB — CBC
HCT: 42.3 % (ref 36.0–46.0)
Hemoglobin: 13.7 g/dL (ref 12.0–15.0)
MCH: 30.6 pg (ref 26.0–34.0)
MCHC: 32.4 g/dL (ref 30.0–36.0)
MCV: 94.4 fL (ref 80.0–100.0)
Platelets: 188 10*3/uL (ref 150–400)
RBC: 4.48 MIL/uL (ref 3.87–5.11)
RDW: 12.7 % (ref 11.5–15.5)
WBC: 4.2 10*3/uL (ref 4.0–10.5)
nRBC: 0 % (ref 0.0–0.2)

## 2022-03-15 IMAGING — DX DG CHEST 2V
2 series · 2 of 2 positions shown · non-contrast
Comparison: [DATE] chest radiograph.

CLINICAL DATA: Chest pain

EXAM:
CHEST - 2 VIEW

[chest lat]
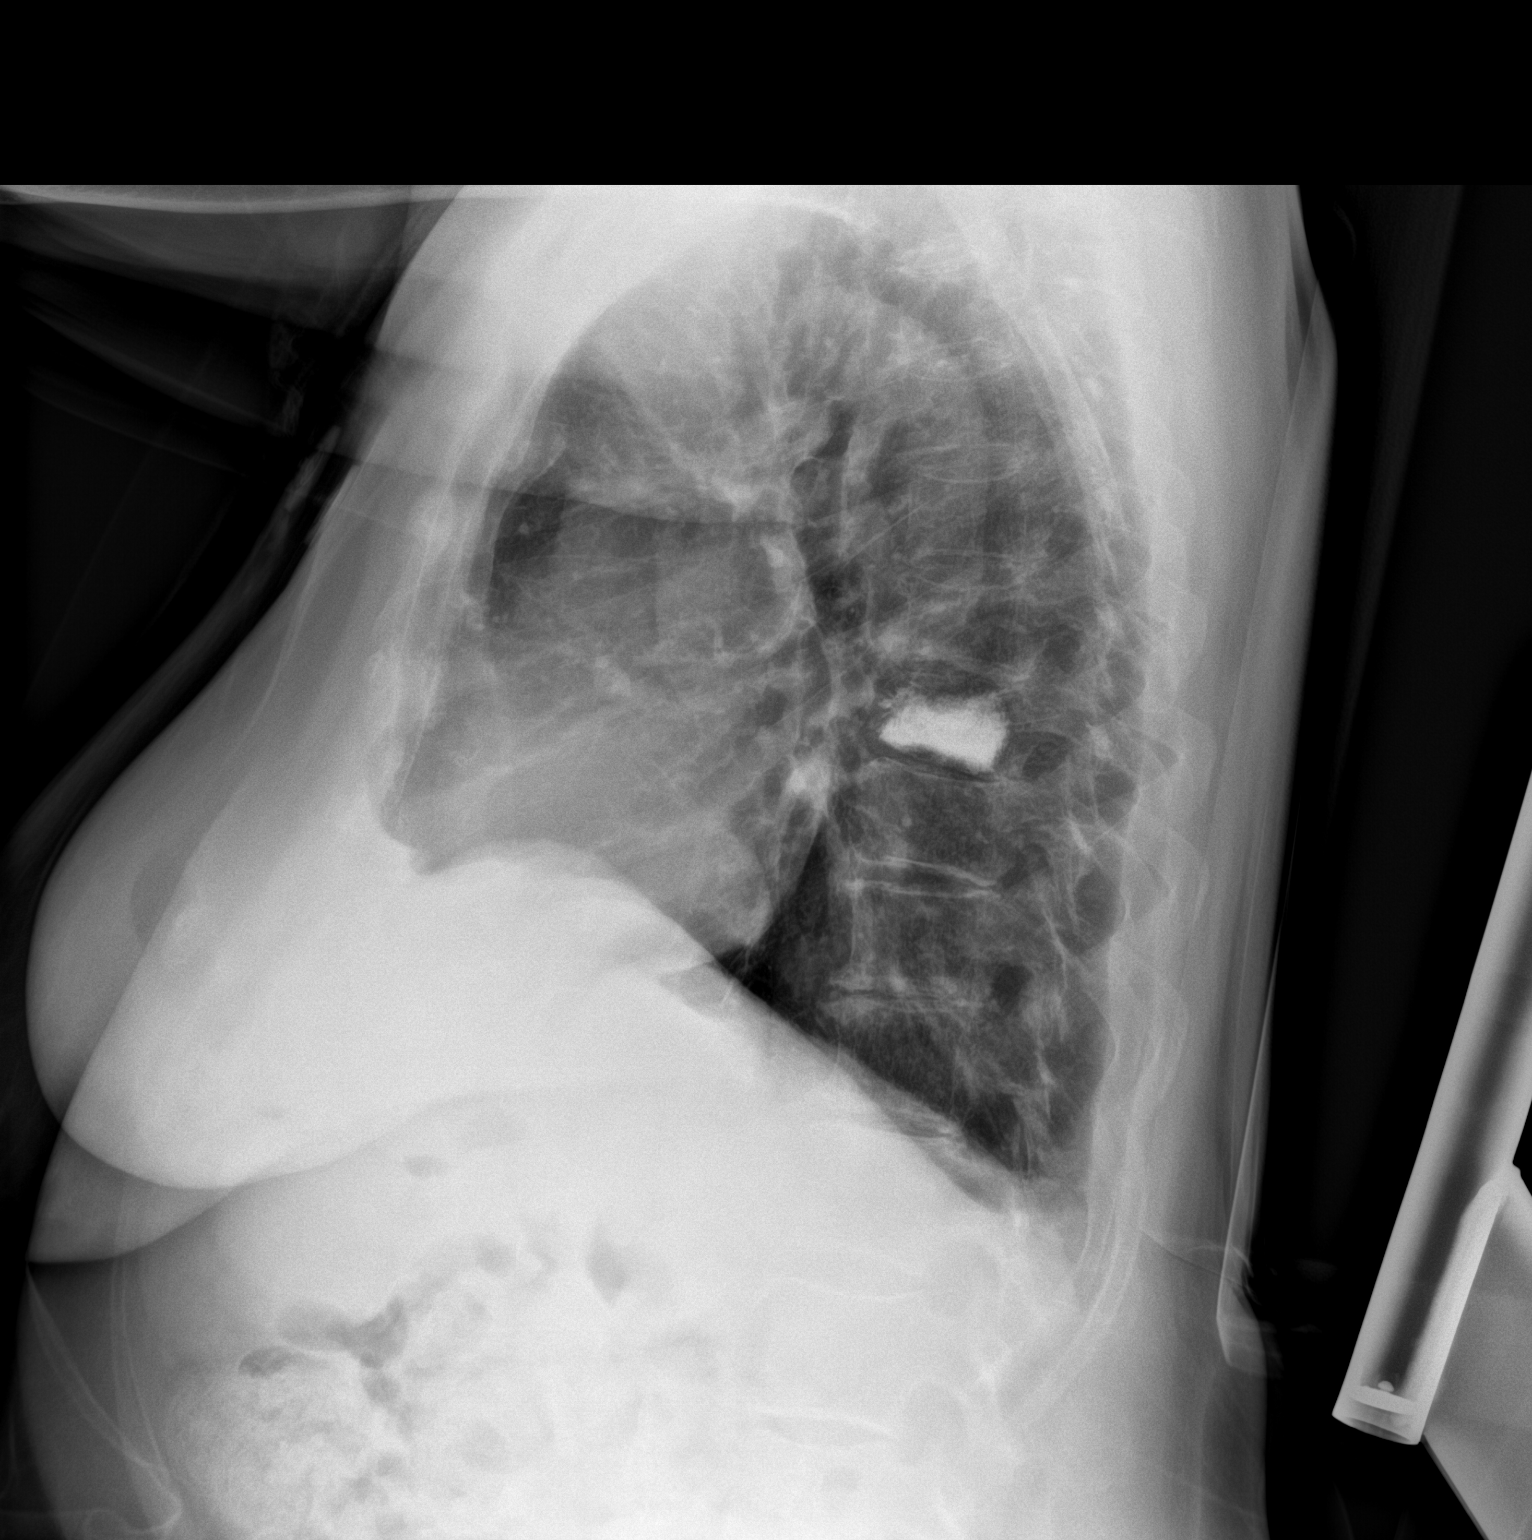

[chest ap]
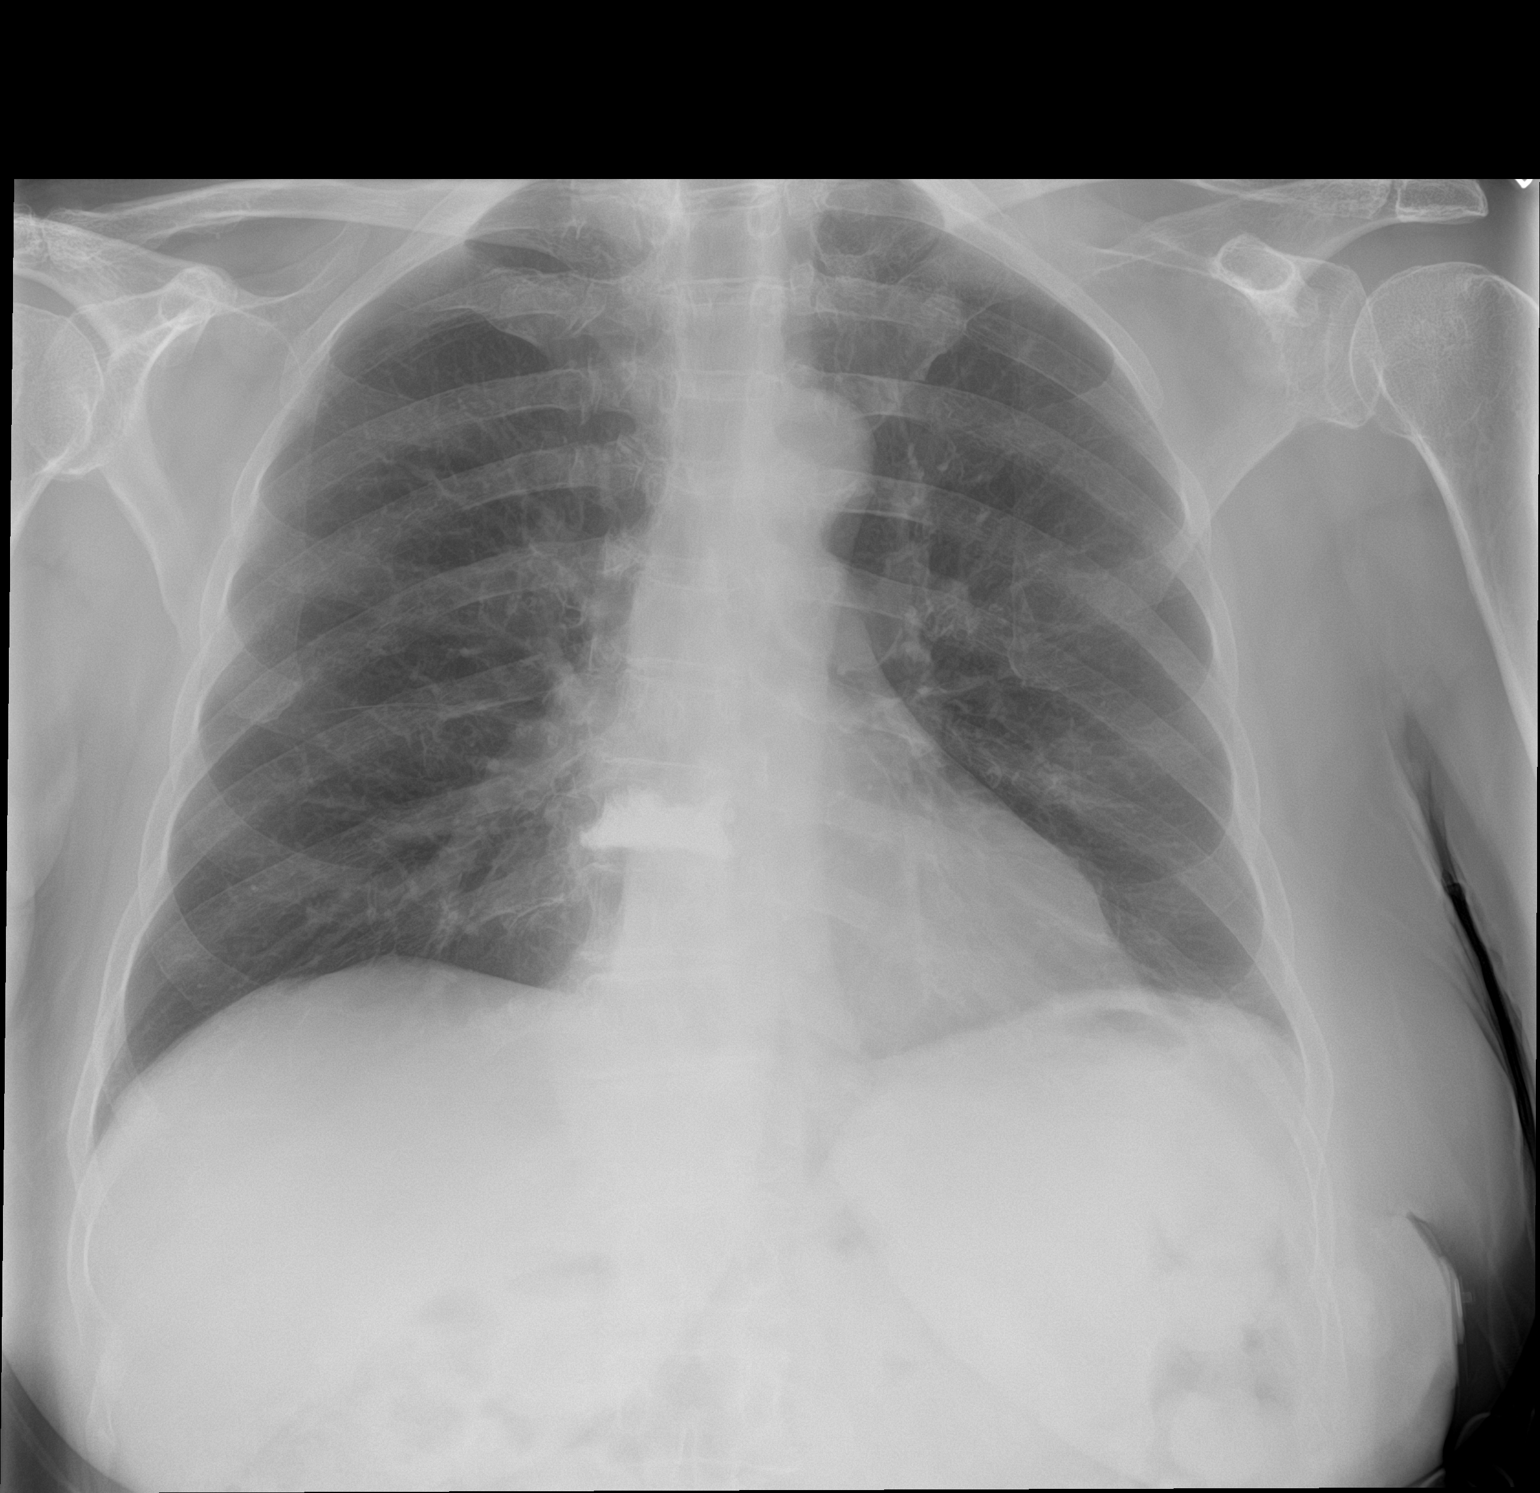

[2 of 2 positions shown; findings below may reference images not displayed]

FINDINGS: Partially visualized surgical hardware from ACDF. Vertebroplasty
material overlies a chronic midthoracic vertebral compression
fracture. Stable cardiomediastinal silhouette with normal heart
size. No pneumothorax. No pleural effusion. Lungs appear clear, with
no acute consolidative airspace disease and no pulmonary edema.
IMPRESSION: No active cardiopulmonary disease.

## 2022-03-15 NOTE — ED Notes (Signed)
Patient returned to the waiting room after chest xray with all her jewelry/belongings. Son made aware that belonging were in patient belonging bag by Rad tech. -DMG  ?

## 2022-03-15 NOTE — ED Triage Notes (Signed)
Pt arrives POV with son. ? ?Pt reports some intermittent left chest pain, dizziness, and shortness of breath. ? ?Reports a recent fall and as a result had a left bruised rib.   ? ?Pt ambulatory to triage, in NAD/ ?

## 2022-03-16 ENCOUNTER — Ambulatory Visit
Admission: RE | Admit: 2022-03-16 | Discharge: 2022-03-16 | Disposition: A | Discharge status: Home / Self Care | Payer: Medicare Other | Source: Ambulatory Visit | Attending: Physician Assistant | Admitting: Physician Assistant

## 2022-03-16 ENCOUNTER — Emergency Department (HOSPITAL_BASED_OUTPATIENT_CLINIC_OR_DEPARTMENT_OTHER)
Admission: EM | Admit: 2022-03-16 | Discharge: 2022-03-16 | Discharge status: Against Medical Advice | Payer: Medicare Other | Attending: Emergency Medicine | Admitting: Emergency Medicine

## 2022-03-16 DIAGNOSIS — R9089 Other abnormal findings on diagnostic imaging of central nervous system: Secondary | ICD-10-CM

## 2022-03-16 IMAGING — MR MR HEAD WO/W CM
14 series · 48 of 48 positions shown · IV contrast (10ml multihance)
Comparison: Head CT [DATE] and MRI [DATE]

CLINICAL DATA: Abnormal brain MRI.  Memory loss.

EXAM:
MRI HEAD WITHOUT AND WITH CONTRAST
TECHNIQUE: Multiplanar, multiecho pulse sequences of the brain and surrounding
structures were obtained without and with intravenous contrast.
CONTRAST:  10mL MULTIHANCE GADOBENATE DIMEGLUMINE 529 MG/ML IV SOLN

[Series 2: T1 · sagittal · 5.0mm · 0.43mm/px · 2 of 23 slices shown]
[im 1/23]
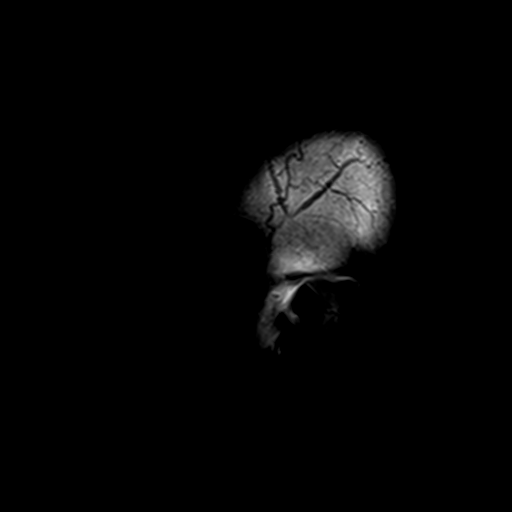
[im 23/23]
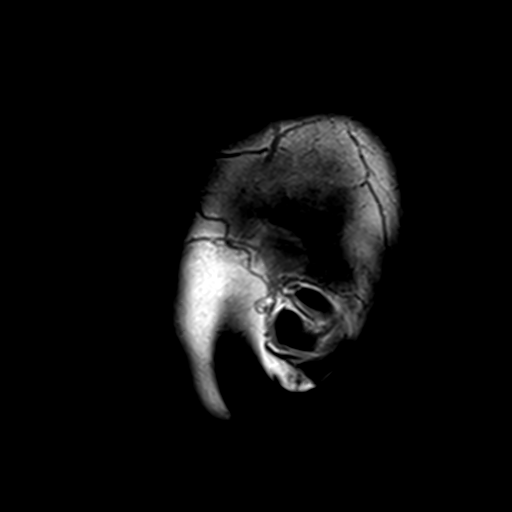

[Series 3: DWI · axial · 3.0mm · 1.80mm/px · z∈[-65,+81]mm · 7 of 99 slices shown (1 of 4)]
[im 1/99]
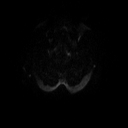
[im 17/99]
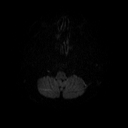
[im 33/99]
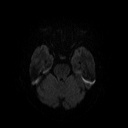
[im 50/99]
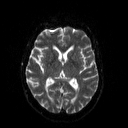
[im 66/99]
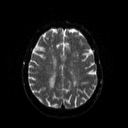
[im 82/99]
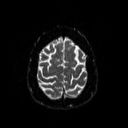
[im 99/99]
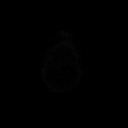

[Series 4: DWI · axial · 3.0mm · 1.80mm/px · z∈[-65,+81]mm · 3 of 48 slices shown (2 of 4)]
[im 1/48]
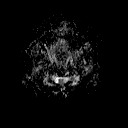
[im 24/48]
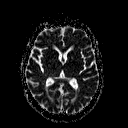
[im 48/48]
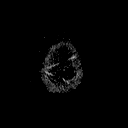

[Series 5: DWI · coronal · 5.0mm · 1.80mm/px · 4 of 66 slices shown (3 of 4)]
[im 1/66]
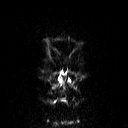
[im 22/66]
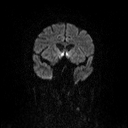
[im 44/66]
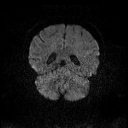
[im 66/66]
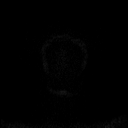

[Series 6: DWI · coronal · 5.0mm · 1.80mm/px · 2 of 34 slices shown (4 of 4)]
[im 1/34]
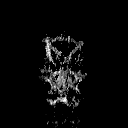
[im 34/34]
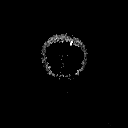

[Series 8: FLAIR · axial · 3.0mm · 0.45mm/px · z∈[-63,+80]mm · 2 of 32 slices shown (1 of 2)]
[im 1/32]
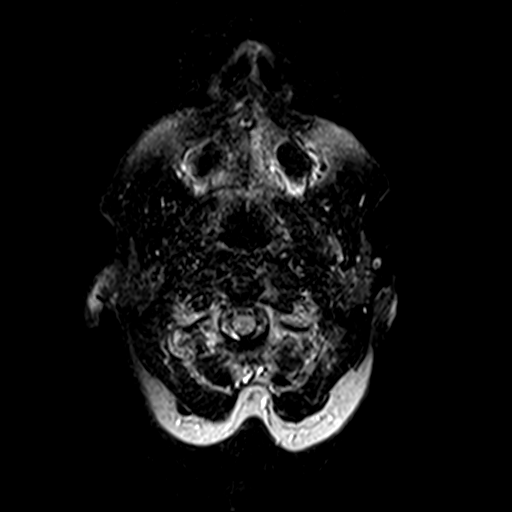
[im 32/32]
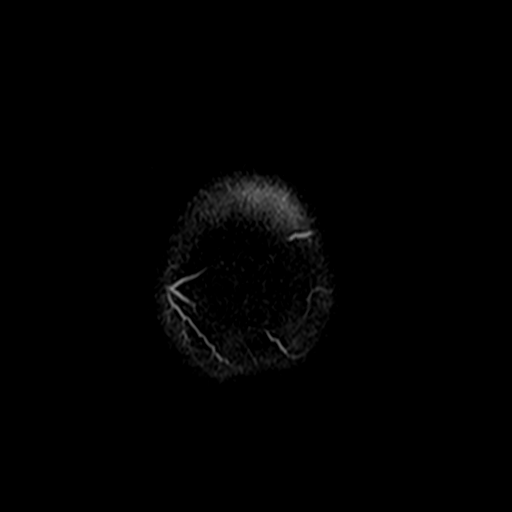

[Series 10: swi_images · axial · 4.0mm · 0.90mm/px · z∈[-62,+78]mm · 2 of 36 slices shown]
[im 1/36]
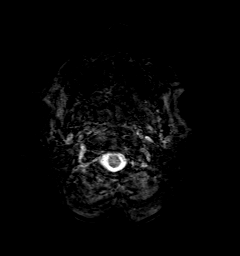
[im 36/36]
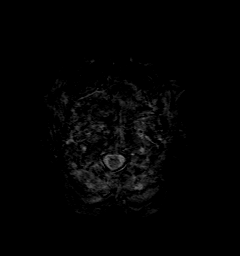

[Series 11: t1_mpr_tra · axial · 1.0mm · 0.75mm/px · z∈[-63,+80]mm · 9 of 144 slices shown]
[im 1/144]
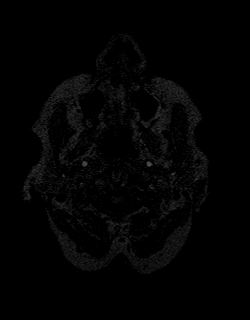
[im 18/144]
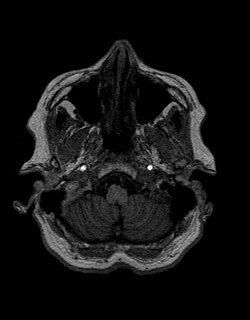
[im 36/144]
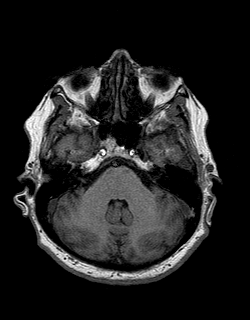
[im 54/144]
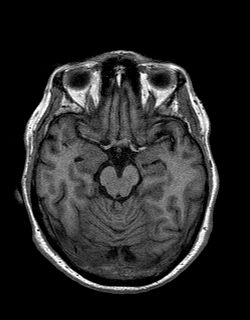
[im 72/144]
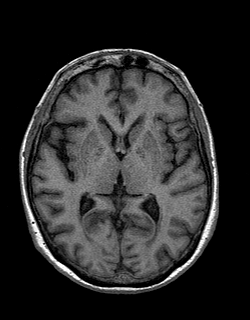
[im 90/144]
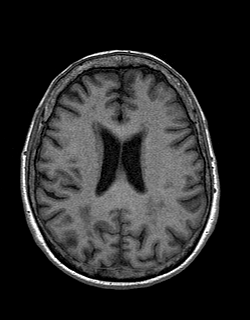
[im 108/144]
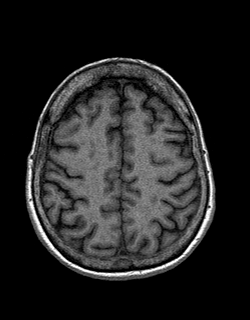
[im 126/144]
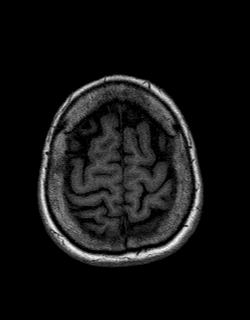
[im 144/144]
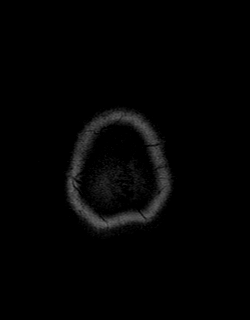

[Series 12: T2 · axial · 5.0mm · 0.60mm/px · 1 of 22 slices shown (1 of 2)]
[im 1/22]
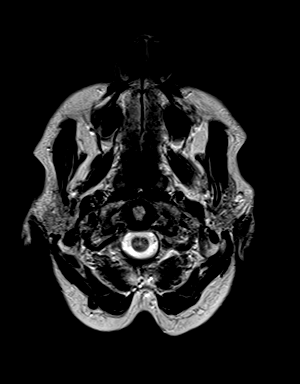

[Series 13: FLAIR · sagittal · 5.0mm · 0.45mm/px · 2 of 25 slices shown (2 of 2)]
[im 1/25]
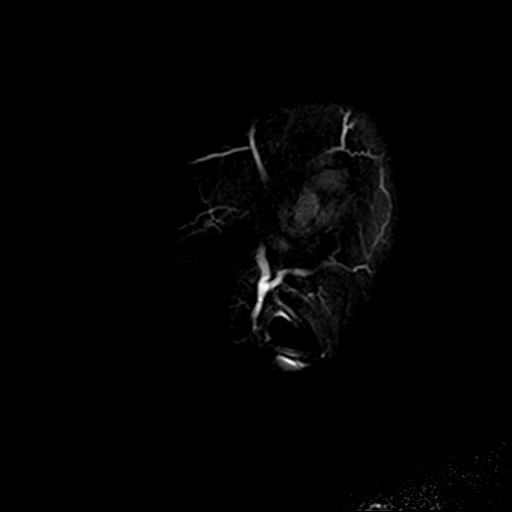
[im 25/25]
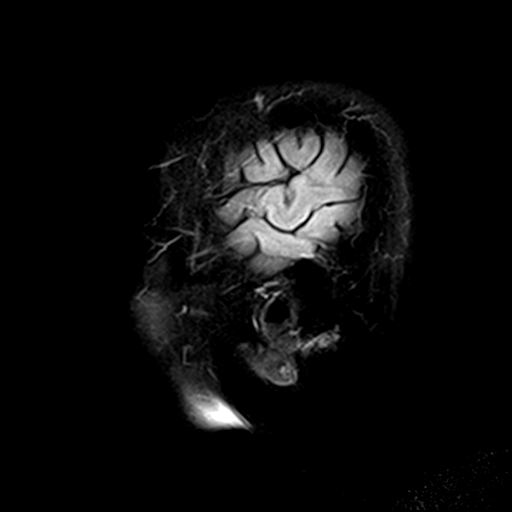

[Series 14: T2 · coronal · 5.0mm · 0.45mm/px · 2 of 25 slices shown (2 of 2)]
[im 1/25]
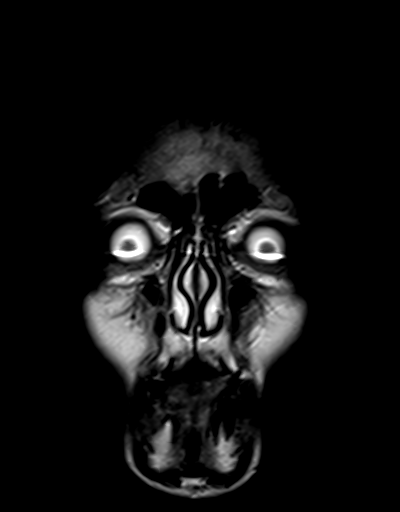
[im 25/25]
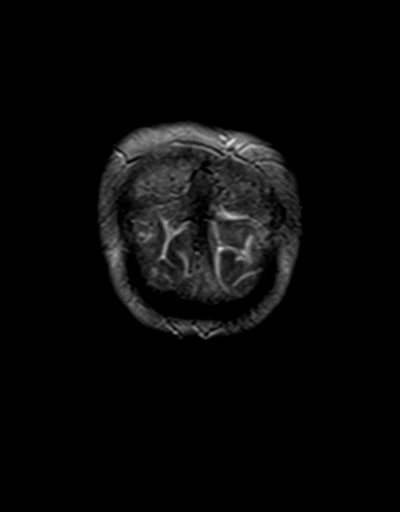

[Series 15: t1_mpr_tra post · axial · 1.0mm · 0.75mm/px · z∈[-63,+80]mm · 9 of 144 slices shown]
[im 1/144]
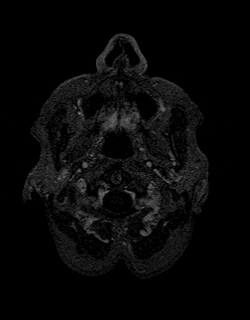
[im 18/144]
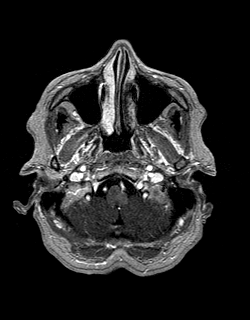
[im 36/144]
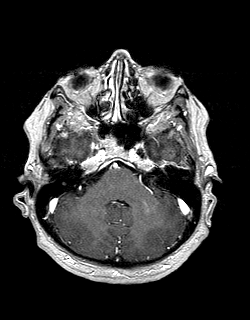
[im 54/144]
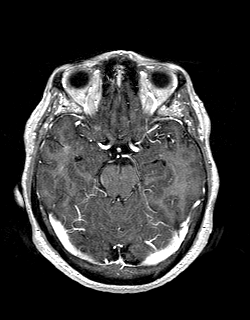
[im 72/144]
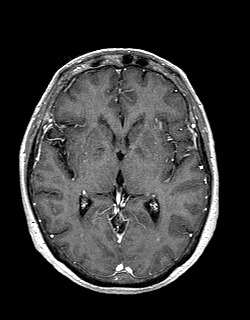
[im 90/144]
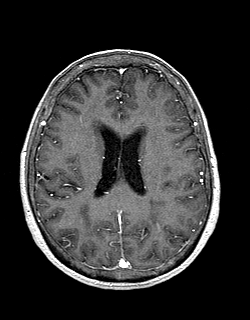
[im 108/144]
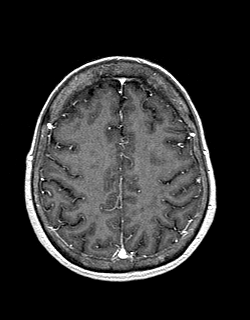
[im 126/144]
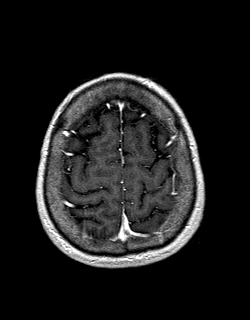
[im 144/144]
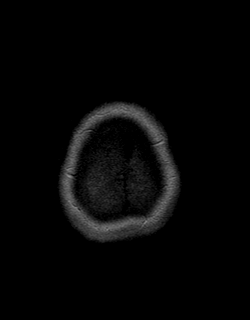

[Series 16: post cor · coronal · 5.0mm · 0.45mm/px · 2 of 25 slices shown]
[im 1/25]
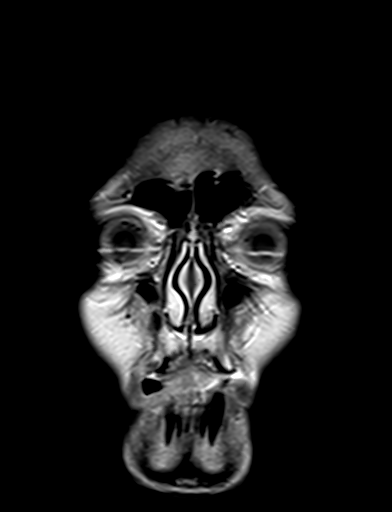
[im 25/25]
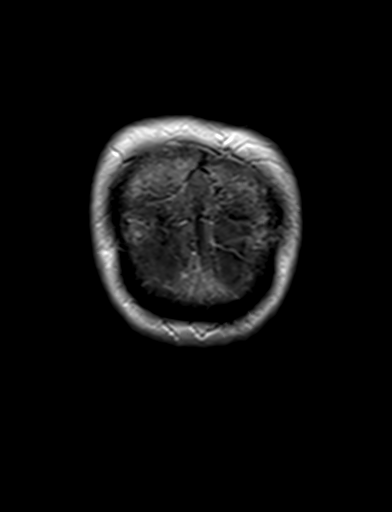

[Series 17: T1 post-contrast · sagittal · 5.0mm · 0.45mm/px · 1 of 23 slices shown]
[im 1/23]
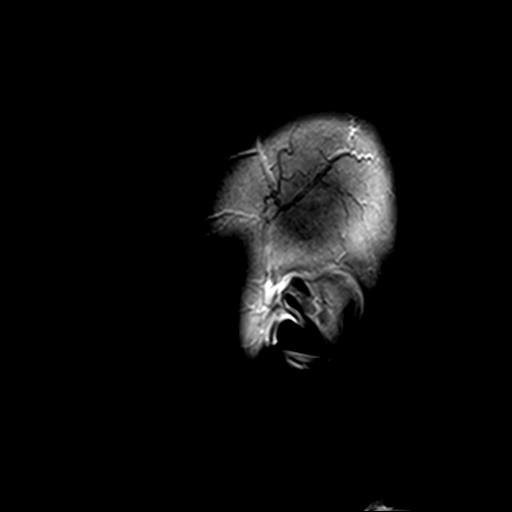

[48 of 48 positions shown; findings below may reference images not displayed]

FINDINGS: Brain: There is no evidence of an acute infarct, mass, midline
shift, or extra-axial fluid collection. FLAIR hyperintensity within
left frontal sulci on the prior MRI has resolved, and there is now
superficial siderosis in this area consistent with old subarachnoid
hemorrhage. Mild superficial siderosis is also noted involving a few
right frontal sulci. T2 hyperintensities in the cerebral white
matter bilaterally are unchanged and nonspecific but compatible with
moderate chronic small vessel ischemic disease. No abnormal
enhancement is identified. Mild generalized cerebral atrophy is
within normal limits for age.

Vascular: Major intracranial vascular flow voids are preserved.

Skull and upper cervical spine: Unremarkable bone marrow signal.

Sinuses/Orbits: Unremarkable orbits. Paranasal sinuses and mastoid
air cells are clear.

Other: None.
IMPRESSION: 1. Superficial siderosis over both frontal convexities consistent
with prior subarachnoid hemorrhage. Left frontal sulcal FLAIR
hyperintensity on the prior MRI was indicative of recent
subarachnoid hemorrhage at that time and has resolved.
2. No acute intracranial abnormality or abnormal enhancement.
3. Moderate chronic small vessel ischemic disease.

## 2022-03-16 MED ORDER — GADOBENATE DIMEGLUMINE 529 MG/ML IV SOLN
10.0000 mL | Freq: Once | INTRAVENOUS | Status: AC | PRN
  Administered 2022-03-16: 10 mL via INTRAVENOUS

## 2022-03-20 ENCOUNTER — Other Ambulatory Visit: Payer: Self-pay

## 2022-03-20 ENCOUNTER — Emergency Department (HOSPITAL_BASED_OUTPATIENT_CLINIC_OR_DEPARTMENT_OTHER): Payer: Medicare Other

## 2022-03-20 ENCOUNTER — Emergency Department (HOSPITAL_BASED_OUTPATIENT_CLINIC_OR_DEPARTMENT_OTHER)
Admission: EM | Admit: 2022-03-20 | Discharge: 2022-03-20 | Disposition: A | Discharge status: Home / Self Care | Payer: Medicare Other | Attending: Emergency Medicine | Admitting: Emergency Medicine

## 2022-03-20 ENCOUNTER — Encounter (HOSPITAL_BASED_OUTPATIENT_CLINIC_OR_DEPARTMENT_OTHER): Payer: Self-pay | Admitting: Emergency Medicine

## 2022-03-20 DIAGNOSIS — E860 Dehydration: Secondary | ICD-10-CM | POA: Insufficient documentation

## 2022-03-20 DIAGNOSIS — R197 Diarrhea, unspecified: Secondary | ICD-10-CM | POA: Insufficient documentation

## 2022-03-20 DIAGNOSIS — G309 Alzheimer's disease, unspecified: Secondary | ICD-10-CM | POA: Insufficient documentation

## 2022-03-20 DIAGNOSIS — F0280 Dementia in other diseases classified elsewhere without behavioral disturbance: Secondary | ICD-10-CM | POA: Diagnosis not present

## 2022-03-20 DIAGNOSIS — N3001 Acute cystitis with hematuria: Secondary | ICD-10-CM

## 2022-03-20 DIAGNOSIS — R531 Weakness: Secondary | ICD-10-CM

## 2022-03-20 LAB — CBC
HCT: 43.1 % (ref 36.0–46.0)
Hemoglobin: 14.2 g/dL (ref 12.0–15.0)
MCH: 30.6 pg (ref 26.0–34.0)
MCHC: 32.9 g/dL (ref 30.0–36.0)
MCV: 92.9 fL (ref 80.0–100.0)
Platelets: 154 10*3/uL (ref 150–400)
RBC: 4.64 MIL/uL (ref 3.87–5.11)
RDW: 12.5 % (ref 11.5–15.5)
WBC: 3.5 10*3/uL — ABNORMAL LOW (ref 4.0–10.5)
nRBC: 0 % (ref 0.0–0.2)

## 2022-03-20 LAB — URINALYSIS, ROUTINE W REFLEX MICROSCOPIC
Bilirubin Urine: NEGATIVE
Glucose, UA: NEGATIVE mg/dL
Hgb urine dipstick: NEGATIVE
Ketones, ur: 15 mg/dL — AB
Nitrite: POSITIVE — AB
Protein, ur: NEGATIVE mg/dL
Specific Gravity, Urine: 1.018 (ref 1.005–1.030)
pH: 6 (ref 5.0–8.0)

## 2022-03-20 LAB — COMPREHENSIVE METABOLIC PANEL
ALT: 18 U/L (ref 0–44)
AST: 23 U/L (ref 15–41)
Albumin: 4.3 g/dL (ref 3.5–5.0)
Alkaline Phosphatase: 82 U/L (ref 38–126)
Anion gap: 11 (ref 5–15)
BUN: 24 mg/dL — ABNORMAL HIGH (ref 8–23)
CO2: 24 mmol/L (ref 22–32)
Calcium: 9 mg/dL (ref 8.9–10.3)
Chloride: 106 mmol/L (ref 98–111)
Creatinine, Ser: 0.98 mg/dL (ref 0.44–1.00)
GFR, Estimated: >60 mL/min (ref >60)
Glucose, Bld: 94 mg/dL (ref 70–99)
Potassium: 3.6 mmol/L (ref 3.5–5.1)
Sodium: 141 mmol/L (ref 135–145)
Total Bilirubin: 0.5 mg/dL (ref 0.3–1.2)
Total Protein: 7.1 g/dL (ref 6.5–8.1)

## 2022-03-20 LAB — LIPASE, BLOOD: Lipase: 11 U/L (ref 11–51)

## 2022-03-20 IMAGING — CT CT ABD-PELV W/O CM
2 of 4 series · 17 of 46 positions shown, 19 images · non-contrast
Comparison: None available.

CLINICAL DATA: 70-year-old female with acute abdominal and pelvic
pain with diarrhea.



[Series 2: abd pel wo · axial · 0.67mm/px · z∈[+636,+996]mm · 14 of 80 slices shown, 16 images]
[im 4/80  soft-tissue]
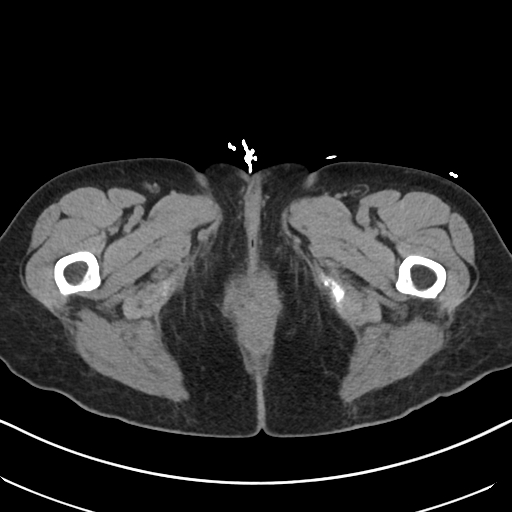
[im 4/80  bone]
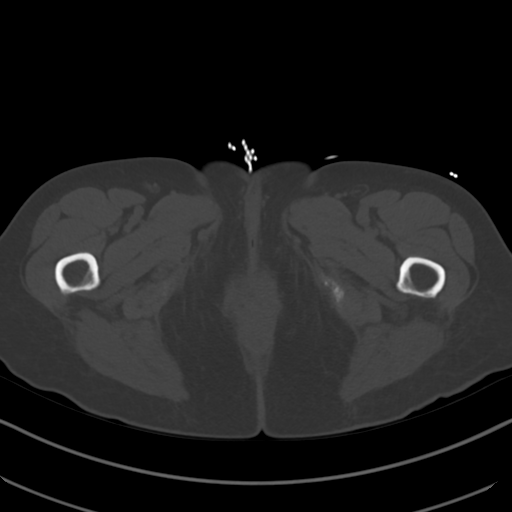
[im 10/80  soft-tissue]
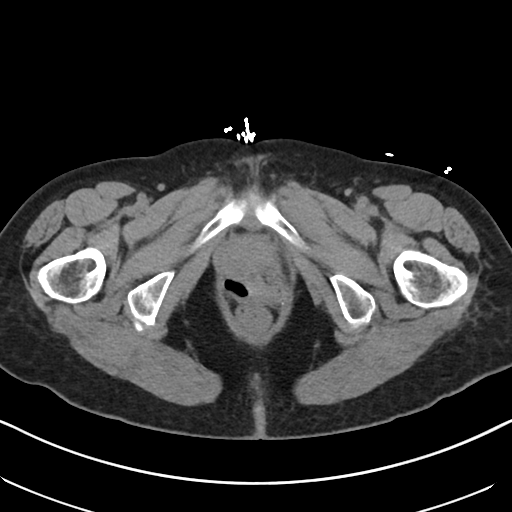
[im 17/80  soft-tissue]
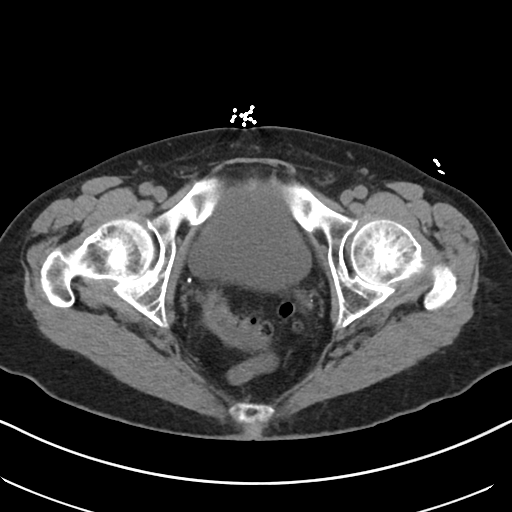
[im 20/80  soft-tissue]
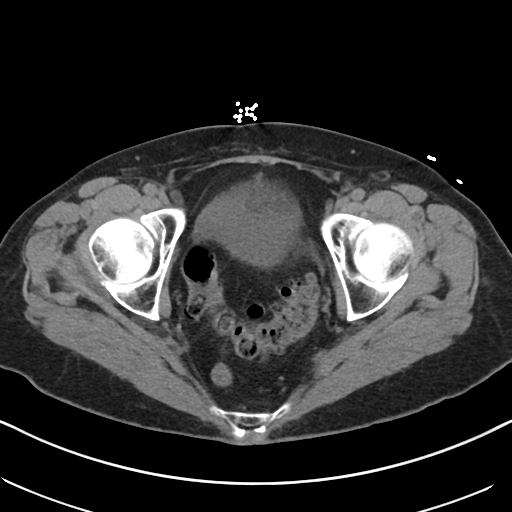
[im 27/80  soft-tissue]
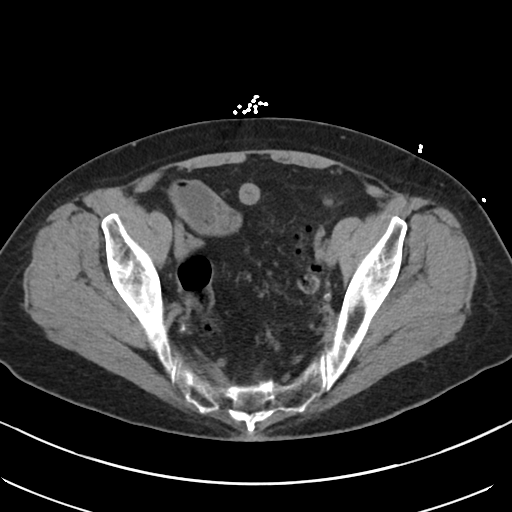
[im 33/80  soft-tissue]
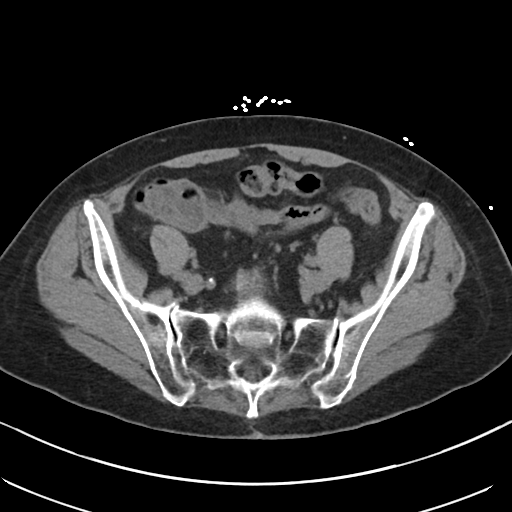
[im 37/80  soft-tissue]
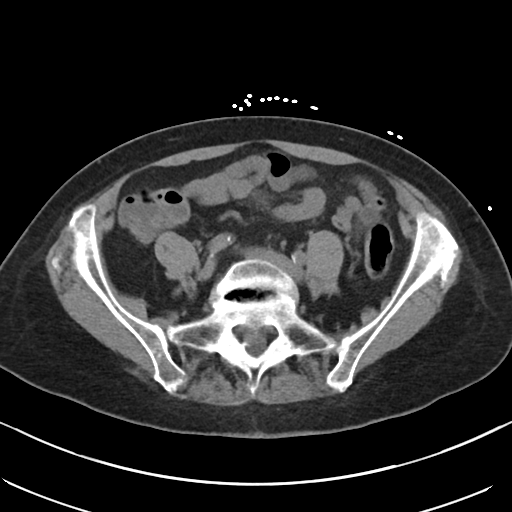
[im 43/80  soft-tissue]
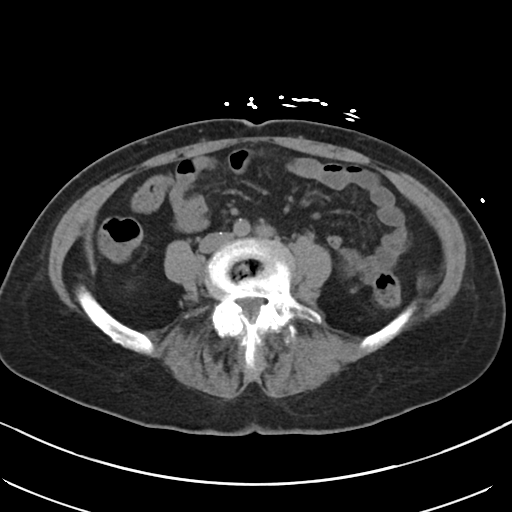
[im 47/80  soft-tissue]
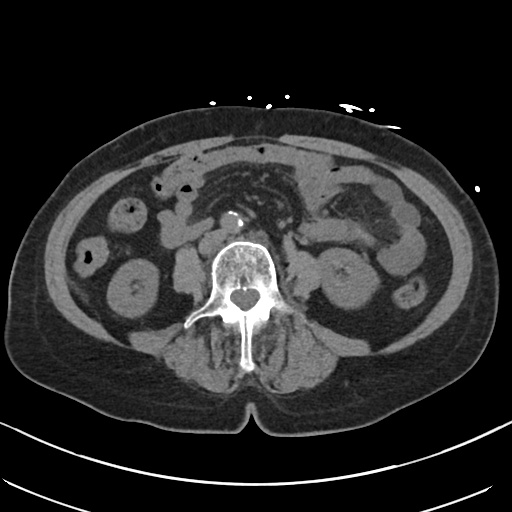
[im 47/80  bone]
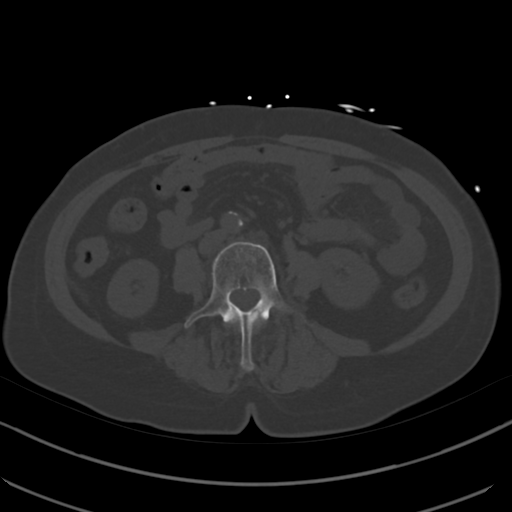
[im 53/80  soft-tissue]
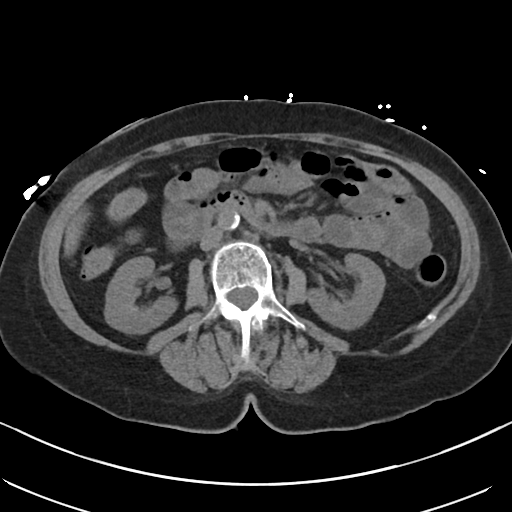
[im 60/80  soft-tissue]
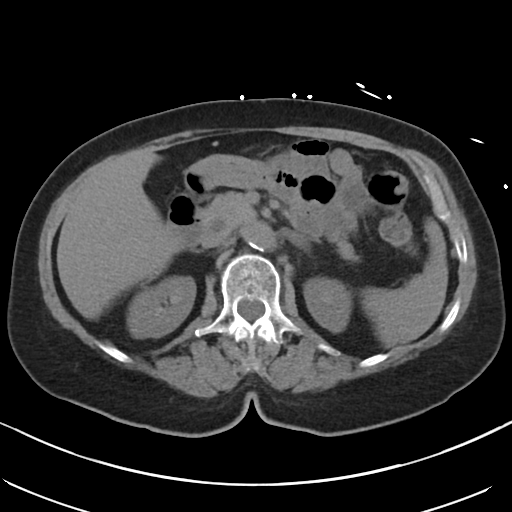
[im 63/80  soft-tissue]
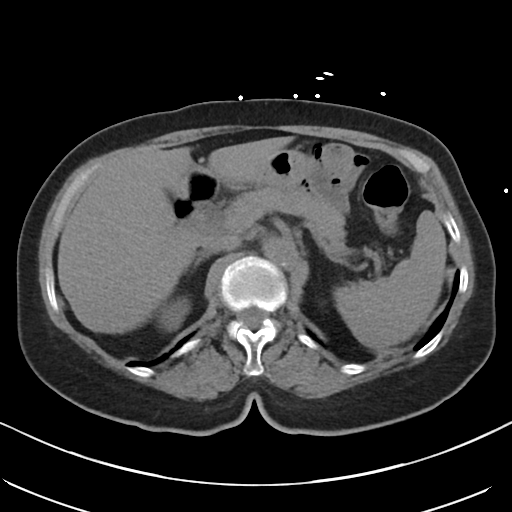
[im 70/80  soft-tissue]
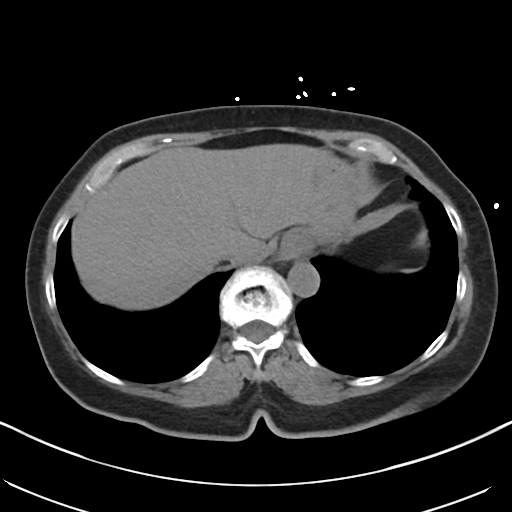
[im 76/80  soft-tissue]
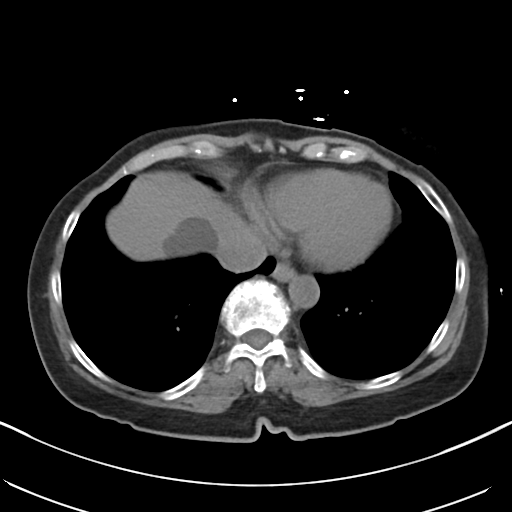

[Series 5: coronal · coronal · 0.81mm/px · 3 of 80 slices shown]
[im 27/80  soft-tissue]
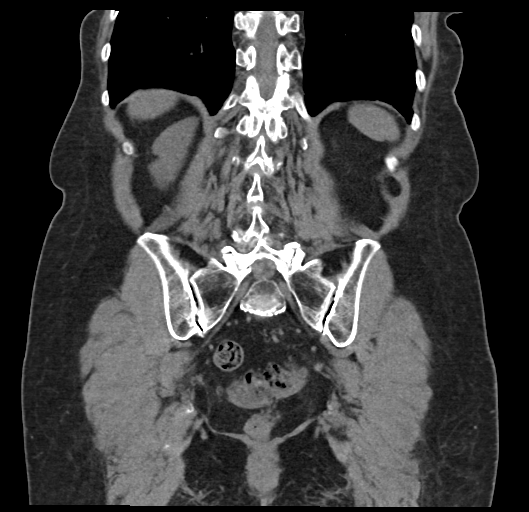
[im 36/80  soft-tissue]
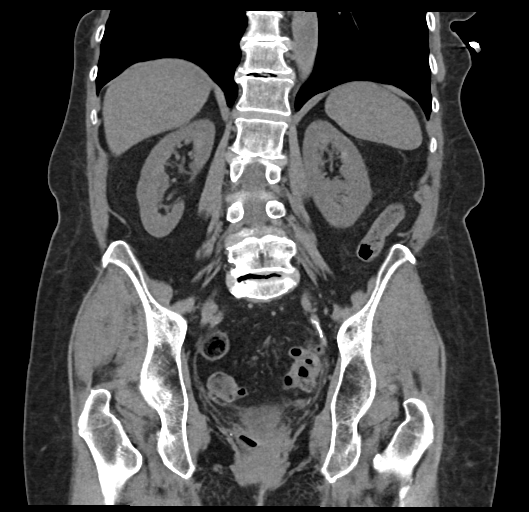
[im 44/80  soft-tissue]
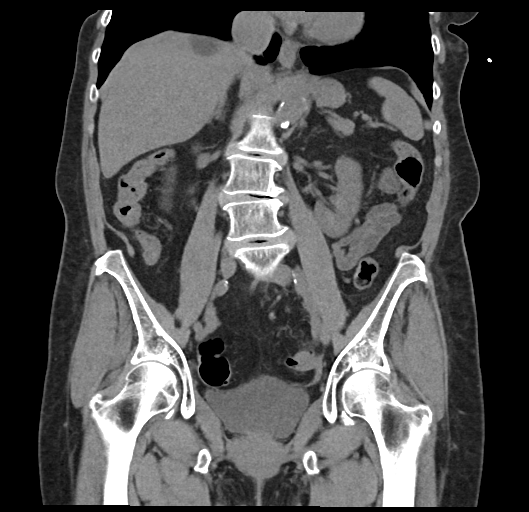

[17 of 46 positions shown; findings below may reference images not displayed]

FINDINGS: Please note that parenchymal and vascular abnormalities may be
missed as intravenous contrast was not administered.

Lower chest: No acute abnormality.

Hepatobiliary: Liver is unremarkable except for hepatic cysts.
Patient is status post cholecystectomy. There is no evidence of
intrahepatic or extrahepatic biliary dilatation.

Pancreas: Unremarkable

Spleen: Unremarkable

Adrenals/Urinary Tract: The kidneys, adrenal glands and bladder are
unremarkable.

Stomach/Bowel: Stomach is within normal limits. Appendix not
definitely identified but no inflammation is noted in the pericecal
region. No evidence of bowel wall thickening, distention, or
inflammatory changes. Colonic diverticulosis noted without evidence
of acute diverticulitis.

Vascular/Lymphatic: Aortic atherosclerosis. No enlarged abdominal or
pelvic lymph nodes.

Reproductive: Status post hysterectomy. No adnexal masses.

Other: No ascites, focal collection or pneumoperitoneum.

Musculoskeletal: Multilevel degenerative disc disease and facet
arthropathy noted. Grade 1 anterolisthesis of L5 on S1 identified.
IMPRESSION: 1. No evidence of acute abnormality.
2. Colonic diverticulosis without evidence of acute diverticulitis.
3. Aortic Atherosclerosis ([3R]-[3R]).

## 2022-03-20 MED ORDER — CEPHALEXIN 500 MG PO CAPS: 1000.0000 mg | ORAL_CAPSULE | Freq: Two times a day (BID) | ORAL | 0 refills | Status: DC

## 2022-03-20 MED ORDER — SODIUM CHLORIDE 0.9 % IV BOLUS
1000.0000 mL | Freq: Once | INTRAVENOUS | Status: AC
  Administered 2022-03-20: 1000 mL via INTRAVENOUS

## 2022-03-20 MED ORDER — ONDANSETRON 4 MG PO TBDP: 4.0000 mg | ORAL_TABLET | ORAL | 0 refills | Status: AC | PRN

## 2022-03-20 MED ORDER — ONDANSETRON 4 MG PO TBDP: 4.0000 mg | ORAL_TABLET | ORAL | 0 refills | Status: DC | PRN

## 2022-03-20 MED ORDER — SODIUM CHLORIDE 0.9 % IV SOLN
1.0000 g | Freq: Once | INTRAVENOUS | Status: AC
  Administered 2022-03-20: 1 g via INTRAVENOUS
  Filled 2022-03-20: qty 10

## 2022-03-20 NOTE — ED Notes (Signed)
Urine collected at 18:35 not 14:33 I charted wrong.  ?

## 2022-03-20 NOTE — ED Notes (Signed)
Orthostatic VS obtained

## 2022-03-20 NOTE — ED Notes (Signed)
Pt was provided with a sprite and teddy grahams. ?

## 2022-03-20 NOTE — Discharge Instructions (Signed)
1.  Start your Keflex tomorrow evening.  You were given an IV dose of Rocephin in the emergency department.  This antibiotic will treat you for 24 hours. ?2.  Try to stay hydrated and eat small nutritious meals.  If you feel dizzy, be very careful as you are walking and use support. ?3.  Return to the emergency department if you get fever, confusion, vomiting and cannot take your medication or other concerning symptoms. ?

## 2022-03-20 NOTE — ED Provider Notes (Signed)
?[facility] EM[facility]PT ?Provider Note ? ? ?CSN: [id number] ?Arrival date & time: 03/20/22  1413 ? ?  ? ?History ? ?Chief Complaint  ?Patient presents with  ? Diarrhea  ? ? ?Dawn Mills is a 71 y.o. female. ? ?HPI ?History obtained both from the patient and by telephone interview with the patient's son with him she lives.  Patient reports that she has had some diarrhea.  She believes it started yesterday.  She denied vomiting however her son reports that a day or so ago she had both vomiting and diarrhea.  No fever.  Patient endorses some lower abdominal pain.  Patient reports that she was up at about 3 AM this morning with symptoms of discomfort.  Patient son reports that she seemed lightheaded.  Sometimes when she stood up to walk around it looks like she might pass out and got pale.  He also reports he does not think that she is drinking enough water.  He reports was a lot of fluids around the house but he does not think she is actually drinking very much.  He is concerned for dehydration.  Patient does have Alzheimer's dementia.  At baseline she is verbally interactive but confused about sequences of events. ? ?Patient son reports has been several months since she has had a urinary tract infection.  He reports he tries to encourage hydration.  No recent antibiotics. ?  ? ?Home Medications ?Prior to Admission medications   ?Medication Sig Start Date End Date Taking? Authorizing Provider  ?cephALEXin (KEFLEX) 500 MG capsule Take 2 capsules (1,000 mg total) by mouth 2 (two) times daily. 03/20/22  Yes Arby Barrette, MD  ?ondansetron (ZOFRAN-ODT) 4 MG disintegrating tablet Take 1 tablet (4 mg total) by mouth every 4 (four) hours as needed for nausea or vomiting. 03/20/22  Yes Arby Barrette, MD  ?QUEtiapine (SEROQUEL) 50 MG tablet Take 1 tablet (50 mg total) by mouth at bedtime. 12/22/21 01/21/22  Terald Sleeper, MD  ?   ? ?Allergies    ?Patient has no known allergies.   ? ?Review of Systems    ?Review of Systems ?10 systems reviewed and negative as per HPI however caveat for Alzheimer's dementia. ?Physical Exam ?Updated Vital Signs ?BP 126/79 (BP Location: Left Arm)   Pulse 71   Temp 97.7 ?F (36.5 ?C) (Oral)   Resp 20   Ht 5\' 5"  (1.651 m)   Wt 59 kg   SpO2 97%   BMI 21.63 kg/m?  ?Physical Exam ?Constitutional:   ?   Comments: Patient is alert and pleasantly interactive.  She is well-nourished and well-developed.  No respiratory distress.  She has clinically well appearance.  ?HENT:  ?   Mouth/Throat:  ?   Mouth: Mucous membranes are moist.  ?   Pharynx: Oropharynx is clear.  ?Eyes:  ?   Extraocular Movements: Extraocular movements intact.  ?Cardiovascular:  ?   Rate and Rhythm: Normal rate.  ?Pulmonary:  ?   Effort: Pulmonary effort is normal.  ?   Breath sounds: Normal breath sounds.  ?Abdominal:  ?   Comments: Abdomen soft.  However mild to moderate discomfort endorsed with lower abdominal palpation symmetrically.  No localizing.  No guarding.  ?Musculoskeletal:     ?   General: No swelling or tenderness. Normal range of motion.  ?   Right lower leg: No edema.  ?   Left lower leg: No edema.  ?Skin: ?   General: Skin is warm and dry.  ?Neurological:  ?  General: No focal deficit present.  ?   Motor: No weakness.  ?   Coordination: Coordination normal.  ?Psychiatric:     ?   Mood and Affect: Mood normal.  ? ? ?ED Results / Procedures / Treatments   ?Labs ?(all labs ordered are listed, but only abnormal results are displayed) ?Labs Reviewed  ?COMPREHENSIVE METABOLIC PANEL - Abnormal; Notable for the following components:  ?    Result Value  ? BUN 24 (*)   ? All other components within normal limits  ?CBC - Abnormal; Notable for the following components:  ? WBC 3.5 (*)   ? All other components within normal limits  ?URINALYSIS, ROUTINE W REFLEX MICROSCOPIC - Abnormal; Notable for the following components:  ? Ketones, ur 15 (*)   ? Nitrite POSITIVE (*)   ? Leukocytes,Ua MODERATE (*)   ? Bacteria, UA  MANY (*)   ? All other components within normal limits  ?URINE CULTURE  ?LIPASE, BLOOD  ? ? ?EKG ?EKG Interpretation ? ?Date/Time:  Saturday March 20 2022 14:21:01 EDT ?Ventricular Rate:  72 ?PR Interval:  139 ?QRS Duration: 86 ?QT Interval:  398 ?QTC Calculation: 436 ?R Axis:   84 ?Text Interpretation: Sinus rhythm Borderline right axis deviation Probable anteroseptal infarct, old Baseline wander in lead(s) II III aVF V3 V4 Confirmed by Marianna Fussykstra, Richard ([id number]) on 03/21/2022 2:13:26 PM ? ?Radiology ?CT Abdomen Pelvis Wo Contrast ? ?Result Date: 03/20/2022 ?CLINICAL DATA:  71 year old female with acute abdominal and pelvic pain with diarrhea. EXAM: CT ABDOMEN AND PELVIS WITHOUT CONTRAST TECHNIQUE: Multidetector CT imaging of the abdomen and pelvis was performed following the standard protocol without IV contrast. RADIATION DOSE REDUCTION: This exam was performed according to the departmental dose-optimization program which includes automated exposure control, adjustment of the mA and/or kV according to patient size and/or use of iterative reconstruction technique. COMPARISON:  None available. FINDINGS: Please note that parenchymal and vascular abnormalities may be missed as intravenous contrast was not administered. Lower chest: No acute abnormality. Hepatobiliary: Liver is unremarkable except for hepatic cysts. Patient is status post cholecystectomy. There is no evidence of intrahepatic or extrahepatic biliary dilatation. Pancreas: Unremarkable Spleen: Unremarkable Adrenals/Urinary Tract: The kidneys, adrenal glands and bladder are unremarkable. Stomach/Bowel: Stomach is within normal limits. Appendix not definitely identified but no inflammation is noted in the pericecal region. No evidence of bowel wall thickening, distention, or inflammatory changes. Colonic diverticulosis noted without evidence of acute diverticulitis. Vascular/Lymphatic: Aortic atherosclerosis. No enlarged abdominal or pelvic lymph nodes.  Reproductive: Status post hysterectomy. No adnexal masses. Other: No ascites, focal collection or pneumoperitoneum. Musculoskeletal: Multilevel degenerative disc disease and facet arthropathy noted. Grade 1 anterolisthesis of L5 on S1 identified. IMPRESSION: 1. No evidence of acute abnormality. 2. Colonic diverticulosis without evidence of acute diverticulitis. 3. Aortic Atherosclerosis (ICD10-I70.0). Electronically Signed   By: Harmon PierJeffrey  Hu M.D.   On: 03/20/2022 16:33   ? ?Procedures ?Procedures  ? ? ?Medications Ordered in ED ?Medications  ?sodium chloride 0.9 % bolus 1,000 mL (0 mLs Intravenous Stopped 03/20/22 1742)  ?cefTRIAXone (ROCEPHIN) 1 g in sodium chloride 0.9 % 100 mL IVPB (1 g Intravenous New Bag/Given 03/20/22 1933)  ? ? ?ED Course/ Medical Decision Making/ A&P ?  ?                        ?Medical Decision Making ?Amount and/or Complexity of Data Reviewed ?Labs: ordered. ?Radiology: ordered. ? ?Risk ?Prescription drug management. ? ? ?Patient presents as outlined.  Clinical appearance is well however with complication of dementia and difficulty obtaining detailed history.  I do believe patient needs further diagnostic work-up.  We will proceed with CT of the abdomen and pelvis to rule out intra-abdominal pathology that could be surgical in nature/bowel obstruction\UTI\colitis. ? ?Patient son is a caregiver at home.  I have spoken with him directly and reviewed the plan and will contact again to review results. ? ?At this time patient is comfortable appearance.  She is pleasant without distress.  I will initiate rehydration with 1 L normal saline.  At this time she does not appear to need pain control. ? ?CT returns without significant abnormal findings.  Patient has remained alert and well in appearance.  She has been rehydrated.  Urinalysis is positive.  Patient is treated with 1 g of Rocephin in the emergency department.  Her son is present at bedside at this time.  Patient is stable for continued home  management.  She has home assistance and good return precautions reviewed. ? ? ? ? ? ? ? ?Final Clinical Impression(s) / ED Diagnoses ?Final diagnoses:  ?Acute cystitis with hematuria  ?Dehydration  ?General weaknes

## 2022-03-20 NOTE — ED Notes (Signed)
Took pt to the bathroom but was not able to provide a urine sample. Pt had a small amount of diarrhea. ?

## 2022-03-20 NOTE — ED Notes (Signed)
Discharge instructions, follow up care, and prescriptions reviewed and explained to pt and pt's son, pt and pt's son verbalized understanding.  ?

## 2022-03-20 NOTE — ED Triage Notes (Signed)
Pt arrived via [facility] from home. Pt EMS pt c/o diarrhea since 3 am today, generalized abdominal pain. Denies N/V. Pt also c/o dizziness on standing. Hx alzheimer's and at baseline mental status per family on scene.   ? ?EMS VS ?BP 140/88 ?HR 76 ?SpO2 96% RA ?

## 2022-03-23 LAB — URINE CULTURE: Culture: >=100000 — AB

## 2022-03-24 ENCOUNTER — Telehealth: Payer: Self-pay

## 2022-03-24 NOTE — Telephone Encounter (Signed)
Post ED Visit - Positive Culture Follow-up: Unsuccessful Patient Follow-up ? ?Culture assessed and recommendations reviewed by: ? ?[]  Elenor Quinones, Pharm.D. ?[]  Heide Guile, Pharm.D., BCPS AQ-ID ?[]  Parks Neptune, Pharm.D., BCPS ?[]  Alycia Rossetti, Pharm.D., BCPS ?[]  [place], Pharm.D., BCPS, AAHIVP ?[]  Legrand Como, Pharm.D., BCPS, AAHIVP ?[]  Wynell Balloon, PharmD ?[]  Vincenza Hews, PharmD, BCPS ? ?Positive urine culture ? ?[]  Patient discharged without antimicrobial prescription and treatment is now indicated ?[x]  Organism is resistant to prescribed ED discharge antimicrobial ?[]  Patient with positive blood cultures ? ? ?Unable to contact patient after multiple attempts, letter will be sent to address on file ? ?Dawn Mills ?03/24/2022, 4:23 PM  ?

## 2022-03-25 ENCOUNTER — Telehealth: Payer: Self-pay | Admitting: Physician Assistant

## 2022-03-25 ENCOUNTER — Ambulatory Visit: Payer: Medicare Other | Admitting: Physician Assistant

## 2022-03-25 ENCOUNTER — Encounter: Payer: Self-pay | Admitting: Physician Assistant

## 2022-03-25 DIAGNOSIS — Z029 Encounter for administrative examinations, unspecified: Secondary | ICD-10-CM

## 2022-04-12 ENCOUNTER — Encounter: Payer: Self-pay | Admitting: Physician Assistant

## 2022-04-12 ENCOUNTER — Ambulatory Visit (INDEPENDENT_AMBULATORY_CARE_PROVIDER_SITE_OTHER): Payer: Medicare Other | Admitting: Physician Assistant

## 2022-04-12 VITALS — BP 160/69 | HR 86 | Ht 65.0 in | Wt 127.0 lb

## 2022-04-12 DIAGNOSIS — F01A2 Vascular dementia, mild, with psychotic disturbance: Secondary | ICD-10-CM

## 2022-04-12 MED ORDER — MEMANTINE HCL 5 MG PO TABS
ORAL_TABLET | ORAL | 11 refills | Status: AC
Start: 2022-04-12 — End: ?

## 2022-04-12 NOTE — Patient Instructions (Addendum)
It was a pleasure to see you today at our office.  ? ?Recommendations: ? ?Start Memantine 5 mg, take one tablet at night for 2 weeks and then take 5 mg times a day  ?Referral to primary care for all of the medical problems  ?Work on placing you in Adult living facility ?Follow up in 3 months ? ?Whom to call: ? ?Memory  decline, memory medications: Call out office [phone]  ? ?For psychiatric meds, mood meds: Please have your primary care physician manage these medications.  ? ?Counseling regarding caregiver distress, including caregiver depression, anxiety and issues regarding community resources, adult day care programs, adult living facilities, or memory care questions:   Feel free to contact Misty Lisabeth Register, Social Worker at [phone] ?  ?For assessment of decision of mental capacity and competency:  Call Dr. Erick Blinks, geriatric psychiatrist at [phone] ?  ?If you have any severe symptoms of a stroke, or other severe issues such as confusion,severe chills or fever, etc call 911 or go to the ER as you may need to be evaluate further ? ? ?Feel free to visit Facebook page " [employer]" for tips of how to care for people with memory problems.  ? ? ? ?RECOMMENDATIONS FOR ALL PATIENTS WITH MEMORY PROBLEMS: ?1. Continue to exercise (Recommend 30 minutes of walking everyday, or 3 hours every week) ?2. Increase social interactions - continue going to [place] and enjoy social gatherings with friends and family ?3. Eat healthy, avoid fried foods and eat more fruits and vegetables ?4. Maintain adequate blood pressure, blood sugar, and blood cholesterol level. Reducing the risk of stroke and cardiovascular disease also helps promoting better memory. ?5. Avoid stressful situations. Live a simple life and avoid aggravations. Organize your time and prepare for the next day in anticipation. ?6. Sleep well, avoid any interruptions of sleep and avoid any distractions in the bedroom that may interfere with  adequate sleep quality ?7. Avoid sugar, avoid sweets as there is a strong link between excessive sugar intake, diabetes, and cognitive impairment ?We discussed the Mediterranean diet, which has been shown to help patients reduce the risk of progressive memory disorders and reduces cardiovascular risk. This includes eating fish, eat fruits and green leafy vegetables, nuts like almonds and hazelnuts, walnuts, and also use olive oil. Avoid fast foods and fried foods as much as possible. Avoid sweets and sugar as sugar use has been linked to worsening of memory function. ? ?There is always a concern of gradual progression of memory problems. If this is the case, then we may need to adjust level of care according to patient needs. Support, both to the patient and caregiver, should then be put into place.  ? ? ? ?FALL PRECAUTIONS: Be cautious when walking. Scan the area for obstacles that may increase the risk of trips and falls. When getting up in the mornings, sit up at the edge of the bed for a few minutes before getting out of bed. Consider elevating the bed at the head end to avoid drop of blood pressure when getting up. Walk always in a well-lit room (use night lights in the walls). Avoid area rugs or power cords from appliances in the middle of the walkways. Use a walker or a cane if necessary and consider physical therapy for balance exercise. Get your eyesight checked regularly. ? ?FINANCIAL OVERSIGHT: Supervision, especially oversight when making financial decisions or transactions is also recommended. ? ?HOME SAFETY: Consider the safety of the kitchen when operating appliances  like stoves, microwave oven, and blender. Consider having supervision and share cooking responsibilities until no longer able to participate in those. Accidents with firearms and other hazards in the house should be identified and addressed as well. ? ? ?ABILITY TO BE LEFT ALONE: If patient is unable to contact 911 operator, consider  using [employer], or when the need is there, arrange for someone to stay with patients. Smoking is a fire hazard, consider supervision or cessation. Risk of wandering should be assessed by caregiver and if detected at any point, supervision and safe proof recommendations should be instituted. ? ?MEDICATION SUPERVISION: Inability to self-administer medication needs to be constantly addressed. Implement a mechanism to ensure safe administration of the medications. ? ? ?DRIVING: Regarding driving, in patients with progressive memory problems, driving will be impaired. We advise to have someone else do the driving if trouble finding directions or if minor accidents are reported. Independent driving assessment is available to determine safety of driving. ? ? ?If you are interested in the driving assessment, you can contact the following: ? ?[facility] in [place] [phone] ? ?[facility] [phone] ? ?[facility] [phone] ? ?[facility] [phone] or [phone] ? ? ? ?Mediterranean Diet ?A Mediterranean diet refers to food and lifestyle choices that are based on the traditions of countries located on the [place]. This way of eating has been shown to help prevent certain conditions and improve outcomes for people who have chronic diseases, like kidney disease and heart disease. ?What are tips for following this plan? ?Lifestyle  ?Cook and eat meals together with your family, when possible. ?Drink enough fluid to keep your urine clear or pale yellow. ?Be physically active every day. This includes: ?Aerobic exercise like running or swimming. ?Leisure activities like gardening, walking, or housework. ?Get 7-8 hours of sleep each night. ?If recommended by your health care provider, drink red wine in moderation. This means 1 glass a day for nonpregnant women and 2 glasses a day for men. A glass of wine equals 5 oz (150 mL). ?Reading food labels  ?Check the  serving size of packaged foods. For foods such as rice and pasta, the serving size refers to the amount of cooked product, not dry. ?Check the total fat in packaged foods. Avoid foods that have saturated fat or trans fats. ?Check the ingredients list for added sugars, such as corn syrup. ?Shopping  ?At the grocery store, buy most of your food from the areas near the walls of the store. This includes: ?Fresh fruits and vegetables (produce). ?Grains, beans, nuts, and seeds. Some of these may be available in unpackaged forms or large amounts (in bulk). ?Fresh seafood. ?Poultry and eggs. ?Low-fat dairy products. ?Buy whole ingredients instead of prepackaged foods. ?Buy fresh fruits and vegetables in-season from local farmers markets. ?Buy frozen fruits and vegetables in resealable bags. ?If you do not have access to quality fresh seafood, buy precooked frozen shrimp or canned fish, such as tuna, salmon, or sardines. ?Buy small amounts of raw or cooked vegetables, salads, or olives from the deli or salad bar at your store. ?Stock your pantry so you always have certain foods on hand, such as olive oil, canned tuna, canned tomatoes, rice, pasta, and beans. ?Cooking  ?Cook foods with extra-virgin olive oil instead of using butter or other vegetable oils. ?Have meat as a side dish, and have vegetables or grains as your main dish. This means having meat in small portions or adding small amounts of meat  to foods like pasta or stew. ?Use beans or vegetables instead of meat in common dishes like chili or lasagna. ?Experiment with different cooking methods. Try roasting or broiling vegetables instead of steaming or saut?eing them. ?Add frozen vegetables to soups, stews, pasta, or rice. ?Add nuts or seeds for added healthy fat at each meal. You can add these to yogurt, salads, or vegetable dishes. ?Marinate fish or vegetables using olive oil, lemon juice, garlic, and fresh herbs. ?Meal planning  ?Plan to eat 1 vegetarian meal one  day each week. Try to work up to 2 vegetarian meals, if possible. ?Eat seafood 2 or more times a week. ?Have healthy snacks readily available, such as: ?Vegetable sticks with hummus. ?[place] yogurt. ?Fruit and nu
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
## 2022-04-13 DIAGNOSIS — F01A2 Vascular dementia, mild, with psychotic disturbance: Secondary | ICD-10-CM | POA: Insufficient documentation

## 2022-04-14 ENCOUNTER — Ambulatory Visit (INDEPENDENT_AMBULATORY_CARE_PROVIDER_SITE_OTHER): Payer: Medicare Other | Admitting: Licensed Clinical Social Worker

## 2022-04-14 DIAGNOSIS — F4321 Adjustment disorder with depressed mood: Secondary | ICD-10-CM

## 2022-04-14 NOTE — BH Specialist Note (Signed)
Integrated Behavioral Health Initial In-Person Visit ? ?MRN: [id number] ?Name: Dawn Mills ? ?Number of Integrated Behavioral Health Clinician visits: No data recorded ?Session Start time: No data recorded   ?Session End time: No data recorded ?Total time in minutes: No data recorded ? ?Types of Service: Family psychotherapy ? ?Interpretor:No. Interpretor Name and Language: NA ? ? ? Warm Hand Off Completed. ? ?  ? ?  ? ? ?Subjective: ?Athea Mills is a 71 y.o. female accompanied by  Dawn Mills ?Patient was referred by Dawn Mills for Dementia Memory Impairment and Caregiver stress . ?Patient reports the following symptoms/concerns: Pt reports reduced ability to recall conversations, events. Feelings of frustrations and sadness due to memory impairment.  Caregiver voiced burden with stress associated with Pt's illness and medical issues and providing care for his mother in the home .  ?Duration of problem: Several months ; Severity of problem: moderate ? ?Objective: ?Mood: Anxious and Affect: Appropriate ?Risk of harm to self or others: No plan to harm self or others ? ?Life Context: ?Family and Social: Pt has DX of Dementia and resides with son who is primary caregiver  ?School/Work: Pt does not work and due to having to provide care for Pt and recent medical issues of pt caregiver reports unable to keep job  ?Self-Care: Pt is able to some ADL's but does need some assistance  ?Life Changes: Pt has significant memory impairment  ? ?Patient and/or Family's Strengths/Protective Factors: ?Concrete supports in place (healthy food, safe environments, etc.) ? ?Goals Addressed: ?Patient will: ?Reduce symptoms of: anxiety and stress ?Increase knowledge and/or ability of: coping skills and stress reduction  ?Demonstrate ability to: Increase healthy adjustment to current life circumstances and Increase adequate support systems for patient/family ? ?Progress towards Goals: ?Ongoing ? ?Interventions: ?Interventions utilized:  Solution-Focused Strategies, Psychoeducation and/or Health Education, and Link to [employer]  ?Standardized Assessments completed: Not Needed ? ?Patient and/or Family Response: Pt and caregiver open to interventions and strategies provides as well as community resources to assist with current situation.  ? ?Patient Centered Plan: ?Patient is on the following Treatment Plan(s):  Use compensatory strategies for components of memory loss resolve some negative emotions associated with the impact of the memory loss, teach solution focus and coping skills to help with agitation. For Caregiver to gain further knowledge of Pt's medical condition, and challenges that are ahead .   Maximize the use of formal and informal support system which can include support group adult day center and respite.  Also look at [facility] for medication and possible special assistance medicaid if needed for long term care arrangement  ? ?Assessment: ?Patient currently experiencing Sadness associated with memory impairment, loss of independence and frustration with caregiver .  Caregiver Dawn Mills) reports increase stress associated with care for his mother and due to her medical issues has not been able to maintain employment  . ?  ?Patient may benefit from Adult day center , increase support, Caregiver with medicaid for additional support in the home as outside the home  . ? ?Plan: ?Follow up with behavioral health clinician on : As needed  ?Behavioral recommendations: Use compensatory strategies for components of memory loss resolve some negative emotions associated with the impact of the memory loss, teach solution focus and coping skills to help with agitation. For Caregiver to gain further knowledge of Pt's medical condition, and challenges that are ahead .   Maximize the use of formal and informal support system which can include support group adult day  center and respite.  Also look at [facility] for medication and possible special  assistance medicaid if needed for long term care arrangement  ?Referral(s): [employer] [facility]:  Adult day Center , DSS, [place] for respite and Caregiver support and Local support group  ?"From scale of 1-10, how likely are you to follow plan?": 9 ? ?Crescencio Jozwiak A Taylor-Paladino, LCSW ? ? ? ? ? ? ? ? ?

## 2022-07-14 ENCOUNTER — Ambulatory Visit: Payer: Medicare Other | Admitting: Physician Assistant

## 2022-07-15 ENCOUNTER — Ambulatory Visit (INDEPENDENT_AMBULATORY_CARE_PROVIDER_SITE_OTHER): Payer: Medicare Other | Admitting: Physician Assistant

## 2022-07-15 VITALS — BP 157/85 | HR 76 | Wt 130.0 lb

## 2022-07-15 DIAGNOSIS — F4321 Adjustment disorder with depressed mood: Secondary | ICD-10-CM | POA: Diagnosis not present

## 2022-07-15 DIAGNOSIS — F01A2 Vascular dementia, mild, with psychotic disturbance: Secondary | ICD-10-CM
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
## 2022-07-15 NOTE — Patient Instructions (Signed)
It was a pleasure to see you today at our office.   Recommendations:  Continue Memantine 5 mg 2  times a day  Referral to primary care for all of the medical problems  Work on placing you in Adult living facility. Follow up with social work  Follow up in 6 months  Whom to call:  Memory  decline, memory medications: Call out office [phone]   For psychiatric meds, mood meds: Please have your primary care physician manage these medications.   Counseling regarding caregiver distress, including caregiver depression, anxiety and issues regarding community resources, adult day care programs, adult living facilities, or memory care questions:   Feel free to contact [place], Social Worker at [phone]   For assessment of decision of mental capacity and competency:  Call Dr. Anthoney Harada, geriatric psychiatrist at [phone]   If you have any severe symptoms of a stroke, or other severe issues such as confusion,severe chills or fever, etc call 911 or go to the ER as you may need to be evaluate further   Feel free to visit Facebook page " [employer]" for tips of how to care for people with memory problems.     RECOMMENDATIONS FOR ALL PATIENTS WITH MEMORY PROBLEMS: 1. Continue to exercise (Recommend 30 minutes of walking everyday, or 3 hours every week) 2. Increase social interactions - continue going to [place] and enjoy social gatherings with friends and family 3. Eat healthy, avoid fried foods and eat more fruits and vegetables 4. Maintain adequate blood pressure, blood sugar, and blood cholesterol level. Reducing the risk of stroke and cardiovascular disease also helps promoting better memory. 5. Avoid stressful situations. Live a simple life and avoid aggravations. Organize your time and prepare for the next day in anticipation. 6. Sleep well, avoid any interruptions of sleep and avoid any distractions in the bedroom that may interfere with adequate sleep quality 7.  Avoid sugar, avoid sweets as there is a strong link between excessive sugar intake, diabetes, and cognitive impairment We discussed the Mediterranean diet, which has been shown to help patients reduce the risk of progressive memory disorders and reduces cardiovascular risk. This includes eating fish, eat fruits and green leafy vegetables, nuts like almonds and hazelnuts, walnuts, and also use olive oil. Avoid fast foods and fried foods as much as possible. Avoid sweets and sugar as sugar use has been linked to worsening of memory function.  There is always a concern of gradual progression of memory problems. If this is the case, then we may need to adjust level of care according to patient needs. Support, both to the patient and caregiver, should then be put into place.     FALL PRECAUTIONS: Be cautious when walking. Scan the area for obstacles that may increase the risk of trips and falls. When getting up in the mornings, sit up at the edge of the bed for a few minutes before getting out of bed. Consider elevating the bed at the head end to avoid drop of blood pressure when getting up. Walk always in a well-lit room (use night lights in the walls). Avoid area rugs or power cords from appliances in the middle of the walkways. Use a walker or a cane if necessary and consider physical therapy for balance exercise. Get your eyesight checked regularly.  FINANCIAL OVERSIGHT: Supervision, especially oversight when making financial decisions or transactions is also recommended.  HOME SAFETY: Consider the safety of the kitchen when operating appliances like stoves, microwave oven, and  blender. Consider having supervision and share cooking responsibilities until no longer able to participate in those. Accidents with firearms and other hazards in the house should be identified and addressed as well.   ABILITY TO BE LEFT ALONE: If patient is unable to contact 911 operator, consider using LifeLine, or when the  need is there, arrange for someone to stay with patients. Smoking is a fire hazard, consider supervision or cessation. Risk of wandering should be assessed by caregiver and if detected at any point, supervision and safe proof recommendations should be instituted.  MEDICATION SUPERVISION: Inability to self-administer medication needs to be constantly addressed. Implement a mechanism to ensure safe administration of the medications.   DRIVING: Regarding driving, in patients with progressive memory problems, driving will be impaired. We advise to have someone else do the driving if trouble finding directions or if minor accidents are reported. Independent driving assessment is available to determine safety of driving.   If you are interested in the driving assessment, you can contact the following:  [employer] in [place] [phone]  [facility] [phone]  [facility] [phone] or [phone]    Mediterranean Diet A Mediterranean diet refers to food and lifestyle choices that are based on the traditions of countries located on the [employer]. This way of eating has been shown to help prevent certain conditions and improve outcomes for people who have chronic diseases, like kidney disease and heart disease. What are tips for following this plan? Lifestyle  Cook and eat meals together with your family, when possible. Drink enough fluid to keep your urine clear or pale yellow. Be physically active every day. This includes: Aerobic exercise like running or swimming. Leisure activities like gardening, walking, or housework. Get 7-8 hours of sleep each night. If recommended by your health care provider, drink red wine in moderation. This means 1 glass a day for nonpregnant women and 2 glasses a day for men. A glass of wine equals 5 oz (150 mL). Reading food labels  Check the serving size of packaged  foods. For foods such as rice and pasta, the serving size refers to the amount of cooked product, not dry. Check the total fat in packaged foods. Avoid foods that have saturated fat or trans fats. Check the ingredients list for added sugars, such as corn syrup. Shopping  At the grocery store, buy most of your food from the areas near the walls of the store. This includes: Fresh fruits and vegetables (produce). Grains, beans, nuts, and seeds. Some of these may be available in unpackaged forms or large amounts (in bulk). Fresh seafood. Poultry and eggs. Low-fat dairy products. Buy whole ingredients instead of prepackaged foods. Buy fresh fruits and vegetables in-season from local farmers markets. Buy frozen fruits and vegetables in resealable bags. If you do not have access to quality fresh seafood, buy precooked frozen shrimp or canned fish, such as tuna, salmon, or sardines. Buy small amounts of raw or cooked vegetables, salads, or olives from the deli or salad bar at your store. Stock your pantry so you always have certain foods on hand, such as olive oil, canned tuna, canned tomatoes, rice, pasta, and beans. Cooking  Cook foods with extra-virgin olive oil instead of using butter or other vegetable oils. Have meat as a side dish, and have vegetables or grains as your main dish. This means having meat in small portions or adding small amounts of meat to foods like pasta or  stew. Use beans or vegetables instead of meat in common dishes like chili or lasagna. Experiment with different cooking methods. Try roasting or broiling vegetables instead of steaming or sauteing them. Add frozen vegetables to soups, stews, pasta, or rice. Add nuts or seeds for added healthy fat at each meal. You can add these to yogurt, salads, or vegetable dishes. Marinate fish or vegetables using olive oil, lemon juice, garlic, and fresh herbs. Meal planning  Plan to eat 1 vegetarian meal one day each week. Try to  work up to 2 vegetarian meals, if possible. Eat seafood 2 or more times a week. Have healthy snacks readily available, such as: Vegetable sticks with hummus. Greek yogurt. Fruit and nut trail mix. Eat balanced meals throughout the week. This includes: Fruit: 2-3 servings a day Vegetables: 4-5 servings a day Low-fat dairy: 2 servings a day Fish, poultry, or lean meat: 1 serving a day Beans and legumes: 2 or more servings a week Nuts and seeds: 1-2 servings a day Whole grains: 6-8 servings a day Extra-virgin olive oil: 3-4 servings a day Limit red meat and sweets to only a few servings a month What are my food choices? Mediterranean diet Recommended Grains: Whole-grain pasta. Brown rice. Bulgar wheat. Polenta. Couscous. Whole-wheat bread. Orpah Cobb. Vegetables: Artichokes. Beets. Broccoli. Cabbage. Carrots. Eggplant. Green beans. Chard. Kale. Spinach. Onions. Leeks. Peas. Squash. Tomatoes. Peppers. Radishes. Fruits: Apples. Apricots. Avocado. Berries. Bananas. Cherries. Dates. Figs. Grapes. Lemons. Melon. Oranges. Peaches. Plums. Pomegranate. Meats and other protein foods: Beans. Almonds. Sunflower seeds. Pine nuts. Peanuts. Cod. Salmon. Scallops. Shrimp. Tuna. Tilapia. Clams. Oysters. Eggs. Dairy: Low-fat milk. Cheese. Greek yogurt. Beverages: Water. Red wine. Herbal tea. Fats and oils: Extra virgin olive oil. Avocado oil. Grape seed oil. Sweets and desserts: [place] yogurt with honey. Baked apples. Poached pears. Trail mix. Seasoning and other foods: Basil. Cilantro. Coriander. Cumin. Mint. Parsley. Sage. Rosemary. Tarragon. Garlic. Oregano. Thyme. Pepper. Balsalmic vinegar. Tahini. Hummus. Tomato sauce. Olives. Mushrooms. Limit these Grains: Prepackaged pasta or rice dishes. Prepackaged cereal with added sugar. Vegetables: Deep fried potatoes (french fries). Fruits: Fruit canned in syrup. Meats and other protein foods: Beef. Pork. Lamb. Poultry with skin. Hot dogs. Tomasa Blase. Dairy:  Ice cream. Sour cream. Whole milk. Beverages: Juice. Sugar-sweetened soft drinks. Beer. Liquor and spirits. Fats and oils: Butter. Canola oil. Vegetable oil. Beef fat (tallow). Lard. Sweets and desserts: Cookies. Cakes. Pies. Candy. Seasoning and other foods: Mayonnaise. Premade sauces and marinades. The items listed may not be a complete list. Talk with your dietitian about what dietary choices are right for you. Summary The Mediterranean diet includes both food and lifestyle choices. Eat a variety of fresh fruits and vegetables, beans, nuts, seeds, and whole grains. Limit the amount of red meat and sweets that you eat. Talk with your health care provider about whether it is safe for you to drink red wine in moderation. This means 1 glass a day for nonpregnant women and 2 glasses a day for men. A glass of wine equals 5 oz (150 mL). This information is not intended to replace advice given to you by your health care provider. Make sure you discuss any questions you have with your health care provider. Document Released: 07/08/2016 Document Revised: 08/10/2016 Document Reviewed: 07/08/2016 Elsevier Interactive Patient Education  2017 [employer].

## 2022-10-07 ENCOUNTER — Other Ambulatory Visit: Payer: Self-pay

## 2022-10-07 ENCOUNTER — Emergency Department (HOSPITAL_COMMUNITY)
Admission: EM | Admit: 2022-10-07 | Discharge: 2022-10-07 | Disposition: A | Discharge status: Home / Self Care | Payer: Medicare Other | Attending: Emergency Medicine | Admitting: Emergency Medicine

## 2022-10-07 ENCOUNTER — Encounter (HOSPITAL_COMMUNITY): Payer: Self-pay

## 2022-10-07 DIAGNOSIS — F02818 Dementia in other diseases classified elsewhere, unspecified severity, with other behavioral disturbance: Secondary | ICD-10-CM

## 2022-10-07 DIAGNOSIS — G309 Alzheimer's disease, unspecified: Secondary | ICD-10-CM | POA: Diagnosis present

## 2022-10-07 DIAGNOSIS — F01518 Vascular dementia, unspecified severity, with other behavioral disturbance: Secondary | ICD-10-CM | POA: Insufficient documentation

## 2022-10-07 DIAGNOSIS — R45851 Suicidal ideations: Secondary | ICD-10-CM | POA: Diagnosis not present

## 2022-10-07 DIAGNOSIS — Z79899 Other long term (current) drug therapy: Secondary | ICD-10-CM | POA: Diagnosis not present

## 2022-10-07 DIAGNOSIS — R410 Disorientation, unspecified: Secondary | ICD-10-CM | POA: Diagnosis present

## 2022-10-07 DIAGNOSIS — F0390 Unspecified dementia without behavioral disturbance: Secondary | ICD-10-CM | POA: Insufficient documentation

## 2022-10-07 DIAGNOSIS — F01A2 Vascular dementia, mild, with psychotic disturbance: Secondary | ICD-10-CM | POA: Diagnosis present

## 2022-10-07 DIAGNOSIS — Z046 Encounter for general psychiatric examination, requested by authority: Secondary | ICD-10-CM

## 2022-10-07 DIAGNOSIS — F03B11 Unspecified dementia, moderate, with agitation: Secondary | ICD-10-CM

## 2022-10-07 LAB — CBC
HCT: 40.6 % (ref 36.0–46.0)
Hemoglobin: 13.4 g/dL (ref 12.0–15.0)
MCH: 31.8 pg (ref 26.0–34.0)
MCHC: 33 g/dL (ref 30.0–36.0)
MCV: 96.2 fL (ref 80.0–100.0)
Platelets: 177 10*3/uL (ref 150–400)
RBC: 4.22 MIL/uL (ref 3.87–5.11)
RDW: 12.5 % (ref 11.5–15.5)
WBC: 4 10*3/uL (ref 4.0–10.5)
nRBC: 0 % (ref 0.0–0.2)

## 2022-10-07 LAB — COMPREHENSIVE METABOLIC PANEL
ALT: 11 U/L (ref 0–44)
AST: 17 U/L (ref 15–41)
Albumin: 3.8 g/dL (ref 3.5–5.0)
Alkaline Phosphatase: 87 U/L (ref 38–126)
Anion gap: 6 (ref 5–15)
BUN: 12 mg/dL (ref 8–23)
CO2: 29 mmol/L (ref 22–32)
Calcium: 9.1 mg/dL (ref 8.9–10.3)
Chloride: 109 mmol/L (ref 98–111)
Creatinine, Ser: 1 mg/dL (ref 0.44–1.00)
GFR, Estimated: >60 mL/min (ref >60)
Glucose, Bld: 91 mg/dL (ref 70–99)
Potassium: 3.9 mmol/L (ref 3.5–5.1)
Sodium: 144 mmol/L (ref 135–145)
Total Bilirubin: 0.6 mg/dL (ref 0.3–1.2)
Total Protein: 6.4 g/dL — ABNORMAL LOW (ref 6.5–8.1)

## 2022-10-07 LAB — SALICYLATE LEVEL: Salicylate Lvl: <7 mg/dL — ABNORMAL LOW (ref 7.0–30.0)

## 2022-10-07 LAB — ETHANOL: Alcohol, Ethyl (B): <10 mg/dL (ref <10)

## 2022-10-07 LAB — ACETAMINOPHEN LEVEL: Acetaminophen (Tylenol), Serum: <10 ug/mL — ABNORMAL LOW (ref 10–30)

## 2022-10-07 MED ORDER — LORAZEPAM 1 MG PO TABS: 1.0000 mg | ORAL_TABLET | Freq: Four times a day (QID) | ORAL | Status: DC | PRN

## 2022-10-07 MED ORDER — MEMANTINE HCL 10 MG PO TABS
5.0000 mg | ORAL_TABLET | Freq: Every day | ORAL | Status: DC
  Administered 2022-10-07: 5 mg via ORAL
  Filled 2022-10-07: qty 1

## 2022-10-07 NOTE — TOC Initial Note (Signed)
Transition of Care [facility]) - Initial/Assessment Note    Patient Details  Name: Dawn Mills MRN: [id number] Date of Birth: 05-06-1951  Transition of Care [facility]) CM/SW Contact:    Dawn Mills, LCSWA Phone Number: 10/07/2022, 7:10 PM  Clinical Narrative:                 [facility] CSW consulted with pts son, Dawn Mills [phone].  CSW explained that pt does not have a skillable need and would not meet criteria for SNF.  TOC does not place pts into respite care or memory care from the ED setting.  CSW provided Dawn Mills with a list of Memory Care facilities to tour and speak with.  CSW also provided Dawn Mills with [facility] contact information.  Pt does live with son and has family support.  Dawn Mills, MSW, LCSW-A Pronouns:  She/Her/Hers [facility] Clinical Social Worker Direct Number:  [phone] [email or web address]   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) 04/13/2022   Major neurocognitive disorder due to Alzheimer's disease, with behavioral disturbance (HCC) 12/25/2021   PCP:  Drema Halon, FNP Pharmacy:   [facility] DRUG STORE [phone] [place], [place] - [address] AT [facility] [address] & [facility] [address] [place]  [address] Phone: [phone] Fax: [phone]  [id number] [place], [place] - [address] AT [facility] [address] [place] [address] Phone: [phone] Fax: [phone]     Social Determinants of Health (SDOH) Interventions    Readmission Risk Interventions     No data to display

## 2022-10-07 NOTE — Discharge Instructions (Addendum)
  Please continue taking your medicines as prescribed by your doctor  Follow-up with your neurologist.  Return to ER if you have worse thoughts of harming yourself or others or hallucinations.                                                                                                   Memory Care     [facility]      [phone]  [address], [place], [place]  Independent Living, Assisted Living, Memory Care   [facility]  [phone]  [address] [place], [place]  Assisted Living, Memory Care   [facility]    [phone]  [address], [place], [place]  Assisted Living, Memory Care   [place]    [phone]  [address] [place], [place]  [address]  Assisted Living, [facility]     [phone]  [address], [place], [place]  Memory Care   [place]    [phone]  [address], [place], [place]  Memory Care   [facility]      [phone]  [address], [place], [place]  Assisted Living, Memory Care   [place]            [phone]  [address] [place], [place]  Independent Living, Memory Care   [facility]      [phone]  [address], [place], [place]    Assisted Living, Memory Care   [facility]     [phone]  [address], [place]  Independent Living, Assisted Living, Memory Care   [facility]     [phone] [address] [place], [place]  Independent Living, Assisted Living, Memory Care   [facility]       [phone]  [address], [place]  Assisted Living, Memory Care   [facility]     [phone], [place], [place]  Independent Living, Assisted Living, Memory Care

## 2022-10-07 NOTE — ED Triage Notes (Signed)
Pt BIB [employer] under IVC. Paperwork states that the pt has reports of SI. Pt denies any SI/HI. Pt is alert, but oriented only to self. Pt denies any complaints. Calm and cooperative at this time.

## 2022-10-07 NOTE — ED Provider Triage Note (Signed)
Emergency Medicine Provider Triage Evaluation Note  Tillie Viverette , a 71 y.o. female  was evaluated in triage.  Patient comes under IVC with [employer].  Paperwork reports suicidal ideation but patient is currently denying suicidal ideation, homicidal ideation, visual/auditory hallucination.  Patient has no bodily complaint.  Patient is unaware why she is here.  Review of Systems  Positive:  Negative:   Physical Exam  BP 136/66 (BP Location: Right Arm)   Pulse 73   Temp 98 F (36.7 C)   Resp 14   Ht 5\' 5"  (1.651 m)   Wt 63.5 kg   SpO2 100%   BMI 23.30 kg/m  Gen:   Awake, no distress   Resp:  Normal effort  MSK:   Moves extremities without difficulty  Other:    Medical Decision Making  Medically screening exam initiated at 4:33 PM.  Appropriate orders placed.  Sana Tessmer was informed that the remainder of the evaluation will be completed by another provider, this initial triage assessment does not replace that evaluation, and the importance of remaining in the ED until their evaluation is complete.     [name], Peter Garter 10/07/22 1635
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
## 2022-10-07 NOTE — Progress Notes (Signed)
TOC CSW spoke with pts son, [name].  [name] stated he would be picking up pt.  Phillip Sandler Tarpley-Carter, MSW, LCSW-A Pronouns:  She/Her/Hers [facility] Clinical Social Worker Direct Number:  [phone] [email or web address]

## 2022-10-07 NOTE — TOC Initial Note (Addendum)
Transition of Care [facility]) - Initial/Assessment Note    Patient Details  Name: Dawn Mills MRN: [id number] Date of Birth: 08-25-1951  Transition [facility]) CM/SW Contact:    Lockie Pares, RN Phone Number: 10/07/2022, 6:59 PM  Clinical Narrative:                  71 year old patient with history of dementia, wandering s/o Si to son. Medically and psychiatrically cleared, needing probable placement. Son has been working with Social work on placement. At this time, there is a question of skillable need. CSW will add resources to AVS.MD added to messages for plan   [facility] will follow for needs, recommendations, and transitions of Care.  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) 04/13/2022   Major neurocognitive disorder due to Alzheimer's disease, with behavioral disturbance (HCC) 12/25/2021   PCP:  Drema Halon, FNP Pharmacy:   [facility] DRUG STORE [phone] [place], [place] - [address] AT [facility] [address] & [facility] [address] [place] [address] Phone: [phone] Fax: [phone]  [id number] [place], [place] - [address] AT [facility] [address] [place] [address] Phone: [phone] Fax: [phone]     Social Determinants of Health (SDOH)  Interventions    Readmission Risk Interventions     No data to display

## 2022-10-07 NOTE — Progress Notes (Signed)
TOC CSW spoke with pts son, [name].  [name] informed CSW that he would be here within the hour to pick up pt.  He had an emergency to deal with, but would be here to pick up pt within the hour.  Haely Leyland Tarpley-Carter, MSW, LCSW-A Pronouns:  She/Her/Hers [facility] Clinical Social Worker Direct Number:  [phone] [email or web address]

## 2022-10-07 NOTE — Progress Notes (Signed)
TOC CSW attempted to contact pts son, Dawn Mills.  CSW left HIPPA compliant message with my contact information.  Riyaan Heroux Tarpley-Carter, MSW, LCSW-A Pronouns:  She/Her/Hers [facility] Clinical Social Worker Direct Number:  [phone] [email or web address]
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
## 2022-10-07 NOTE — ED Notes (Signed)
Ivc paperwork in process 

## 2022-10-07 NOTE — ED Notes (Signed)
Pt reports burning with urination. MD notified.

## 2022-10-07 NOTE — ED Notes (Signed)
Pt is no longer IVC IT'S BEEN RESCINDED PAPER WORK FAX AND ORIGINAL IN THE RED FOLDER

## 2022-10-14 ENCOUNTER — Emergency Department (HOSPITAL_COMMUNITY)
Admission: EM | Admit: 2022-10-14 | Discharge: 2022-10-14 | Disposition: A | Discharge status: Home / Self Care | Payer: Medicare Other | Attending: Emergency Medicine | Admitting: Emergency Medicine

## 2022-10-14 DIAGNOSIS — N390 Urinary tract infection, site not specified: Secondary | ICD-10-CM | POA: Insufficient documentation

## 2022-10-14 DIAGNOSIS — W19XXXA Unspecified fall, initial encounter: Secondary | ICD-10-CM

## 2022-10-14 DIAGNOSIS — M25519 Pain in unspecified shoulder: Secondary | ICD-10-CM | POA: Diagnosis not present

## 2022-10-14 LAB — CBC WITH DIFFERENTIAL/PLATELET
Abs Immature Granulocytes: 0 10*3/uL (ref 0.00–0.07)
Basophils Absolute: 0 10*3/uL (ref 0.0–0.1)
Basophils Relative: 1 %
Eosinophils Absolute: 0.1 10*3/uL (ref 0.0–0.5)
Eosinophils Relative: 3 %
HCT: 42.3 % (ref 36.0–46.0)
Hemoglobin: 13.6 g/dL (ref 12.0–15.0)
Immature Granulocytes: 0 %
Lymphocytes Relative: 36 %
Lymphs Abs: 1.4 10*3/uL (ref 0.7–4.0)
MCH: 31.1 pg (ref 26.0–34.0)
MCHC: 32.2 g/dL (ref 30.0–36.0)
MCV: 96.8 fL (ref 80.0–100.0)
Monocytes Absolute: 0.3 10*3/uL (ref 0.1–1.0)
Monocytes Relative: 7 %
Neutro Abs: 2 10*3/uL (ref 1.7–7.7)
Neutrophils Relative %: 53 %
Platelets: 189 10*3/uL (ref 150–400)
RBC: 4.37 MIL/uL (ref 3.87–5.11)
RDW: 12.1 % (ref 11.5–15.5)
WBC: 3.8 10*3/uL — ABNORMAL LOW (ref 4.0–10.5)
nRBC: 0 % (ref 0.0–0.2)

## 2022-10-14 LAB — BASIC METABOLIC PANEL
Anion gap: 8 (ref 5–15)
BUN: 10 mg/dL (ref 8–23)
CO2: 27 mmol/L (ref 22–32)
Calcium: 9.3 mg/dL (ref 8.9–10.3)
Chloride: 108 mmol/L (ref 98–111)
Creatinine, Ser: 0.72 mg/dL (ref 0.44–1.00)
GFR, Estimated: >60 mL/min (ref >60)
Glucose, Bld: 105 mg/dL — ABNORMAL HIGH (ref 70–99)
Potassium: 3.8 mmol/L (ref 3.5–5.1)
Sodium: 143 mmol/L (ref 135–145)

## 2022-10-14 LAB — URINALYSIS, ROUTINE W REFLEX MICROSCOPIC
Bilirubin Urine: NEGATIVE
Glucose, UA: NEGATIVE mg/dL
Hgb urine dipstick: NEGATIVE
Ketones, ur: NEGATIVE mg/dL
Nitrite: POSITIVE — AB
Protein, ur: NEGATIVE mg/dL
Specific Gravity, Urine: 1.008 (ref 1.005–1.030)
pH: 6 (ref 5.0–8.0)

## 2022-10-14 MED ORDER — MEMANTINE HCL 5 MG PO TABS
5.0000 mg | ORAL_TABLET | Freq: Every day | ORAL | Status: DC
  Administered 2022-10-14: 5 mg via ORAL
  Filled 2022-10-14: qty 1

## 2022-10-14 MED ORDER — CEPHALEXIN 500 MG PO CAPS
500.0000 mg | ORAL_CAPSULE | Freq: Three times a day (TID) | ORAL | Status: DC
  Administered 2022-10-14: 500 mg via ORAL
  Filled 2022-10-14: qty 1

## 2022-10-14 MED ORDER — CEPHALEXIN 500 MG PO CAPS: 500.0000 mg | ORAL_CAPSULE | Freq: Two times a day (BID) | ORAL | 0 refills | Status: AC

## 2022-10-14 NOTE — Progress Notes (Signed)
This CSW attempted to contact the pt's son, [name] via phone without success. Unable to leave vm.

## 2022-10-14 NOTE — ED Notes (Signed)
[facility] called  

## 2022-10-14 NOTE — ED Triage Notes (Signed)
Ems brings pt in from home. Pt reports right shoulder pain and left hand pain. States she stumbled into a wall yesterday while walking. Son is wanting to talk with Child psychotherapist.

## 2022-10-14 NOTE — ED Notes (Signed)
Family updated as to patient's status.

## 2022-10-14 NOTE — ED Provider Notes (Signed)
[facility]-EMERGENCY DEPT Provider Note   CSN: [id number] Arrival date & time: 10/14/22  1247     History  Chief Complaint  Patient presents with   Shoulder Pain    Dawn Mills is a 71 y.o. female.  Patient is brought in by ambulance from home for evaluation of injuries from a fall.  Patient denies any complaints and is not sure why she is here.  She said her son called the ambulance on her.  She denies any complaints.  Patient was seen here 7 days ago at [facility] under an IVC for reportedly wandering off and causing issues with the neighbors.  She was screened by psychiatry and cleared.  TOC was consulted at that time and she did not meet criteria for SNF.  Son was given a list of memory care facilities to evaluate.  Patient denies any medical problems and states she does not take any medications.  Her chart indicates she is on memantine and Seroquel.  The history is provided by the patient.       Home Medications Prior to Admission medications   Medication Sig Start Date End Date Taking? Authorizing Provider  cephALEXin (KEFLEX) 500 MG capsule Take 2 capsules (1,000 mg total) by mouth 2 (two) times daily. Patient not taking: Reported on 04/12/2022 03/20/22   Arby Barrette, MD  memantine (NAMENDA) 5 MG tablet Take 1 tablet (5 mg at night) for 2 weeks, then increase to 1 tablet (5 mg) twice a day 04/12/22   Marcos Eke, PA-C  ondansetron (ZOFRAN-ODT) 4 MG disintegrating tablet Take 1 tablet (4 mg total) by mouth every 4 (four) hours as needed for nausea or vomiting. Patient not taking: Reported on 04/12/2022 03/20/22   Arby Barrette, MD  QUEtiapine (SEROQUEL) 50 MG tablet Take 1 tablet (50 mg total) by mouth at bedtime. 12/22/21 04/12/22  Terald Sleeper, MD      Allergies    Patient has no known allergies.    Review of Systems   Review of Systems  Constitutional:  Negative for fever.  HENT:  Negative for sore throat.   Eyes:  Negative for visual  disturbance.  Respiratory:  Negative for shortness of breath.   Cardiovascular:  Negative for chest pain.  Gastrointestinal:  Negative for abdominal pain.  Genitourinary:  Negative for dysuria.  Musculoskeletal:  Negative for gait problem.  Skin:  Negative for rash.  Neurological:  Negative for headaches.    Physical Exam Updated Vital Signs BP 129/82 (BP Location: Right Arm)   Pulse 67   Temp 98.3 F (36.8 C) (Oral)   Resp 16   SpO2 99%  Physical Exam Vitals and nursing note reviewed.  Constitutional:      General: She is not in acute distress.    Appearance: Normal appearance. She is well-developed.  HENT:     Head: Normocephalic and atraumatic.  Eyes:     Conjunctiva/sclera: Conjunctivae normal.  Cardiovascular:     Rate and Rhythm: Normal rate and regular rhythm.     Heart sounds: No murmur heard. Pulmonary:     Effort: Pulmonary effort is normal. No respiratory distress.     Breath sounds: Normal breath sounds.  Abdominal:     Palpations: Abdomen is soft.     Tenderness: There is no abdominal tenderness. There is no guarding or rebound.  Musculoskeletal:        General: No tenderness or deformity. Normal range of motion.     Cervical back: Neck supple.  Skin:    General: Skin is warm and dry.     Capillary Refill: Capillary refill takes less than 2 seconds.  Neurological:     General: No focal deficit present.     Mental Status: She is alert. She is disoriented.     Sensory: No sensory deficit.     Motor: No weakness.     Comments: Patient is awake and alert.  She knows she is at the hospital and the ambulance brought her here.  She denies any complaints.  She thinks it Saturday in September and cannot tell me the year.  She knows she lives with her son.     ED Results / Procedures / Treatments   Labs (all labs ordered are listed, but only abnormal results are displayed) Labs Reviewed  BASIC METABOLIC PANEL - Abnormal; Notable for the following components:       Result Value   Glucose, Bld 105 (*)    All other components within normal limits  CBC WITH DIFFERENTIAL/PLATELET - Abnormal; Notable for the following components:   WBC 3.8 (*)    All other components within normal limits  URINALYSIS, ROUTINE W REFLEX MICROSCOPIC - Abnormal; Notable for the following components:   APPearance HAZY (*)    Nitrite POSITIVE (*)    Leukocytes,Ua LARGE (*)    Bacteria, UA RARE (*)    All other components within normal limits  URINE CULTURE    EKG None  Radiology No results found.  Procedures Procedures    Medications Ordered in ED Medications - No data to display  ED Course/ Medical Decision Making/ A&P Clinical Course as of 10/14/22 2026  Thu Oct 14, 2022  1441 I called the patient's son, left a voicemail. [MB]  1521 Social work was able to reach son and she explained to him that we are unable to place her in memory care.  This will need to be done as an outpatient.  Son is apparently agreeable to take patient home. [MB]    Clinical Course User Index [MB] Terrilee Files, MD                           Medical Decision Making Amount and/or Complexity of Data Reviewed Labs: ordered.  Risk Prescription drug management.   This patient complains of nothing; this involves an extensive number of treatment Options and is a complaint that carries with it a high risk of complications and morbidity. The differential includes possible fall contusion dementia infection dehydration anemia  I ordered, reviewed and interpreted labs, which included CBC with a low white count seen prior, stable hemoglobin, chemistries normal, urinalysis possible infection 21-50 white blood cells nitrite positive sent for culture I ordered medication oral Keflex and reviewed PMP when indicated. Previous records obtained and reviewed in epic including prior ED visit, psychiatry notes, social work notes I consulted social work and discussed lab and imaging findings  and discussed disposition.  Cardiac monitoring reviewed, normal sinus rhythm Social determinants considered, patient with dementia unable to advocate for self Critical Interventions: None  After the interventions stated above, I reevaluated the patient and found patient to be asymptomatic in no distress Admission and further testing considered, no indications for admission or further work-up at this time.  Social work is contacting her son to arrange transport home.  He was given resources to pursue for memory care unit.  Return instructions discussed  Final Clinical Impression(s) / ED Diagnoses Final diagnoses:  Fall, initial encounter  Urinary tract infection without hematuria, site unspecified    Rx / DC Orders ED Discharge Orders          Ordered    cephALEXin (KEFLEX) 500 MG capsule  2 times daily        10/14/22 1528              Terrilee Files, MD 10/14/22 2028

## 2022-10-14 NOTE — Progress Notes (Addendum)
Transition of Care [facility]) - Emergency Department Mini Assessment   Patient Details  Name: Dawn Mills MRN: [id number] Date of Birth: 11-09-1951  [facility] CM/SW Contact:    Princella Ion, LCSW Phone Number: 10/14/2022, 2:40 PM   Clinical Narrative: This CSW contacted the pt's son, [name], who states he is trying to get his mom placed into memory care and has not had any success. This CSW informed [name] that the hospital does not place patients directly into memory care and inquired about whether he has contacted facilities on the list provided by the CSW on last week at [facility] ED. [name] states he does still have the list and has called places on there and is currently working with the pt's PCP and Social Worker to complete an FL2. This CSW encouraged [name] to follow up with PCP and SW to see if there are any cancellations so he may bring his mom in sooner than next month. [name] states understanding and that he will follow up. This CSW also referred the pt to [facility] in hopes of providing additional support on securing placement. Whitney informed this CSW that she will reach out to the family today. [name] voiced concerns about not being able to get much sleep due to staying up to make sure his mother does not walk out of the home. This CSW encouraged private duty aids and also security systems as additional safety measures if possible. [name] states he currently does not have a way to pick up his mom from the hospital but says he can get a ride to get her if [facility] cannot transport her home. This CSW has notified Nash Dimmer, Charity fundraiser. TOC is unable to place the pt into SNF due to the pt having Medicare A/B without the [facility] Waiver and not having a 3 night Inpatient stay. TOC signing off.    ED Mini Assessment: What brought you to the Emergency Department? 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) 04/13/2022   Major neurocognitive disorder due to Alzheimer's disease, with behavioral disturbance (HCC) 12/25/2021   PCP:  Drema Halon, FNP Pharmacy:   [id number] [place], [place] - [address] AT [facility] OF [address] & [facility] [address] [place] [address] Phone: [phone] Fax: [phone]  [id number] - [place], [place] - [address] AT [facility] [address] [place] [address] Phone: [phone] Fax: [phone]

## 2022-10-14 NOTE — Discharge Instructions (Signed)
You are seen in the emergency department for evaluation of injuries from a fall.  You did not have any obvious sign of any injury.  Your lab work was unremarkable although your urine showed possible infection and we are starting you on an antibiotic.  Please follow-up with your primary care doctor.  Return to the emergency department if any worsening or concerning symptoms.

## 2022-10-18 LAB — URINE CULTURE: Culture: >=100000 — AB

## 2022-10-19 ENCOUNTER — Telehealth (HOSPITAL_BASED_OUTPATIENT_CLINIC_OR_DEPARTMENT_OTHER): Payer: Self-pay | Admitting: Emergency Medicine

## 2022-10-19 NOTE — Telephone Encounter (Signed)
Post ED Visit - Positive Culture Follow-up  Culture report reviewed by antimicrobial stewardship pharmacist: [facility] []  , Pharm.D. []  Enzo Bi, Pharm.D., BCPS AQ-ID []  , Pharm.D., BCPS [x]  Celedonio Miyamoto, Pharm.D., BCPS []  Brogan, Garvin Fila.D., BCPS, AAHIVP []  , Pharm.D., BCPS, AAHIVP []  Georgina Pillion, PharmD, BCPS []  , PharmD, BCPS []  [place], PharmD, BCPS []  [address], PharmD []  , PharmD, BCPS []  Estella Husk, PharmD  Pharmacy Team []  Lysle Pearl, PharmD []  , PharmD []  Phillips Climes, PharmD []  , Rph []  Agapito Games) , PharmD []  Verlan Friends, PharmD []  , PharmD []  Mervyn Gay, PharmD []  , PharmD []  Vinnie Level, PharmD []  W[facility] Old[facility], PharmD []  , PharmD []  Len Childs, PharmD   Positive urine culture Treated with cephalexin, likely colonization and no further patient follow-up is required at this time.  10/19/2022, 12:18 PM

## 2022-11-09 ENCOUNTER — Encounter (HOSPITAL_COMMUNITY): Payer: Self-pay

## 2022-11-09 ENCOUNTER — Emergency Department (HOSPITAL_COMMUNITY)
Admission: EM | Admit: 2022-11-09 | Discharge: 2022-11-10 | Disposition: A | Discharge status: Home / Self Care | Payer: Medicare Other | Attending: Emergency Medicine | Admitting: Emergency Medicine

## 2022-11-09 ENCOUNTER — Emergency Department (HOSPITAL_COMMUNITY): Payer: Medicare Other

## 2022-11-09 ENCOUNTER — Other Ambulatory Visit: Payer: Self-pay

## 2022-11-09 DIAGNOSIS — N390 Urinary tract infection, site not specified: Secondary | ICD-10-CM | POA: Diagnosis not present

## 2022-11-09 DIAGNOSIS — S4991XA Unspecified injury of right shoulder and upper arm, initial encounter: Secondary | ICD-10-CM | POA: Diagnosis not present

## 2022-11-09 DIAGNOSIS — Y92009 Unspecified place in unspecified non-institutional (private) residence as the place of occurrence of the external cause: Secondary | ICD-10-CM | POA: Diagnosis not present

## 2022-11-09 DIAGNOSIS — X58XXXA Exposure to other specified factors, initial encounter: Secondary | ICD-10-CM | POA: Insufficient documentation

## 2022-11-09 DIAGNOSIS — F0390 Unspecified dementia without behavioral disturbance: Secondary | ICD-10-CM | POA: Insufficient documentation

## 2022-11-09 DIAGNOSIS — M25511 Pain in right shoulder: Secondary | ICD-10-CM

## 2022-11-09 LAB — CBC WITH DIFFERENTIAL/PLATELET
Abs Immature Granulocytes: 0 10*3/uL (ref 0.00–0.07)
Basophils Absolute: 0 10*3/uL (ref 0.0–0.1)
Basophils Relative: 1 %
Eosinophils Absolute: 0.1 10*3/uL (ref 0.0–0.5)
Eosinophils Relative: 3 %
HCT: 38.4 % (ref 36.0–46.0)
Hemoglobin: 12.6 g/dL (ref 12.0–15.0)
Immature Granulocytes: 0 %
Lymphocytes Relative: 35 %
Lymphs Abs: 1.4 10*3/uL (ref 0.7–4.0)
MCH: 31.4 pg (ref 26.0–34.0)
MCHC: 32.8 g/dL (ref 30.0–36.0)
MCV: 95.8 fL (ref 80.0–100.0)
Monocytes Absolute: 0.4 10*3/uL (ref 0.1–1.0)
Monocytes Relative: 9 %
Neutro Abs: 2.1 10*3/uL (ref 1.7–7.7)
Neutrophils Relative %: 52 %
Platelets: 181 10*3/uL (ref 150–400)
RBC: 4.01 MIL/uL (ref 3.87–5.11)
RDW: 12.3 % (ref 11.5–15.5)
WBC: 4 10*3/uL (ref 4.0–10.5)
nRBC: 0 % (ref 0.0–0.2)

## 2022-11-09 LAB — COMPREHENSIVE METABOLIC PANEL
ALT: 11 U/L (ref 0–44)
AST: 19 U/L (ref 15–41)
Albumin: 3.7 g/dL (ref 3.5–5.0)
Alkaline Phosphatase: 88 U/L (ref 38–126)
Anion gap: 9 (ref 5–15)
BUN: 7 mg/dL — ABNORMAL LOW (ref 8–23)
CO2: 23 mmol/L (ref 22–32)
Calcium: 8.8 mg/dL — ABNORMAL LOW (ref 8.9–10.3)
Chloride: 107 mmol/L (ref 98–111)
Creatinine, Ser: 0.67 mg/dL (ref 0.44–1.00)
GFR, Estimated: >60 mL/min (ref >60)
Glucose, Bld: 106 mg/dL — ABNORMAL HIGH (ref 70–99)
Potassium: 3.9 mmol/L (ref 3.5–5.1)
Sodium: 139 mmol/L (ref 135–145)
Total Bilirubin: 0.3 mg/dL (ref 0.3–1.2)
Total Protein: 6.5 g/dL (ref 6.5–8.1)
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
## 2022-11-10 ENCOUNTER — Encounter (HOSPITAL_COMMUNITY): Payer: Self-pay

## 2022-11-10 DIAGNOSIS — S4991XA Unspecified injury of right shoulder and upper arm, initial encounter: Secondary | ICD-10-CM | POA: Diagnosis not present

## 2022-11-10 LAB — URINALYSIS, ROUTINE W REFLEX MICROSCOPIC
Bilirubin Urine: NEGATIVE
Glucose, UA: NEGATIVE mg/dL
Hgb urine dipstick: NEGATIVE
Ketones, ur: NEGATIVE mg/dL
Nitrite: POSITIVE — AB
Protein, ur: NEGATIVE mg/dL
Specific Gravity, Urine: 1.011 (ref 1.005–1.030)
WBC, UA: >50 WBC/hpf — ABNORMAL HIGH (ref 0–5)
pH: 5 (ref 5.0–8.0)

## 2022-11-10 MED ORDER — CEPHALEXIN 250 MG PO CAPS
1000.0000 mg | ORAL_CAPSULE | Freq: Once | ORAL | Status: AC
  Administered 2022-11-10: 1000 mg via ORAL
  Filled 2022-11-10: qty 4

## 2022-11-10 MED ORDER — CEPHALEXIN 250 MG PO CAPS
500.0000 mg | ORAL_CAPSULE | Freq: Four times a day (QID) | ORAL | Status: DC
  Administered 2022-11-10: 500 mg via ORAL
  Filled 2022-11-10: qty 2

## 2022-11-10 MED ORDER — MEMANTINE HCL 10 MG PO TABS
10.0000 mg | ORAL_TABLET | Freq: Two times a day (BID) | ORAL | Status: DC
  Administered 2022-11-10: 10 mg via ORAL
  Filled 2022-11-10: qty 1

## 2022-11-10 MED ORDER — QUETIAPINE FUMARATE 25 MG PO TABS: 25.0000 mg | ORAL_TABLET | Freq: Every day | ORAL | Status: DC

## 2022-11-10 MED ORDER — CEPHALEXIN 500 MG PO CAPS: 500.0000 mg | ORAL_CAPSULE | Freq: Four times a day (QID) | ORAL | 0 refills | Status: DC

## 2022-11-10 NOTE — Discharge Instructions (Addendum)
[facility]   [address], [place], [place] [address]   [phone]    [facility]  [address] [place]  [phone]   Memory Care Facilities in [place], [place]    Also serving communities of [place].      [facility]   [address], [place], [place] [address]   [phone]       [facility]   [address], [place], [place] [address]   [phone]      [facility]   [address], [place], [place] [address]   [phone]      [facility]   [address] [place], [place] [address]   [phone]      [facility]   [address], [place], [place] [address]   [phone]      [facility]   [address] [place], [place] [address]   [phone]      [place] [facility]   [address], [place], [place] [address]   [phone]      [facility]   [address] [place], [place] [address]   [phone]      [place] [facility]   [address], [place], [place] [address]   [phone]      [facility] [place] [facility]   [address] [place], [place] [address]   [phone]      [facility]   [address] [place], [place] [address]    You were seen in the emergency department for your shoulder pain.  You had no signs of broken bones.  You were found to have a urinary tract infection and this is treated with antibiotics.  You should complete this as prescribed.  We are unable to place you into a memory care nursing home from the ER and you can follow-up with your primary doctor and with social work with assistance with filing the paperwork necessary to be placed into a nursing home.  You should return to  the emergency department for fevers despite the antibiotics, vomiting and not urinating the antibiotics or any other new or concerning symptoms.
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
## 2022-11-10 NOTE — Progress Notes (Signed)
TOC CSW confirmed with pts son, Logan Bores [phone] that he would be picking pt up.  [name] confirmed that he would be here in an hour.  Alette Kataoka Tarpley-Carter, MSW, LCSW-A Pronouns:  She/Her/Hers [facility] Clinical Social Worker Direct Number:  [phone] [email or web address]

## 2022-11-10 NOTE — ED Provider Notes (Signed)
Patient was signed out to me at 0700 by Dr. Clayborne Dana pending social work evaluation.  In short this is a 71 year old female with a past medical history of dementia who was brought to the emergency department by her son for shoulder pain and concern for inability to care for her at home.  Her workup showed no signs of acute traumatic injury and incidentally showed a UTI.  Patient was evaluated by social work and is unable to be placed directly into a memory care facility from the ED.  Family was given instructions and resources on how to apply for memory care units and were given facility options that they can fax her information to.  Social work requested a TEFL teacher for her to be able to be placed into a memory care unit.  Patient is stable for discharge home.  She will be given Keflex for her UTI.  Patient and son were given strict return precautions.   Rexford Maus, DO 11/10/22 [phone]

## 2022-11-10 NOTE — Progress Notes (Signed)
CSW spoke with patients son to let him know his mother will not be able to stay on the hospital for memory care placement. CSW told patients son she will attach memory care facilities to his mothers AVS and order a TB test for patient. Patient does not meet criteria for SNF and would also needs to be admitted for a 3 night qualifying stay to be considered for SNF. Patient currently walks on her own. Patients son voiced understanding.

## 2022-11-10 NOTE — Discharge Planning (Signed)
Pt recently active with [facility] for Home Health services RN as confirmed by [facility] with [facility] of [place].  Pt will resume HH services with [facility]. No DME needs identified at this time.
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
## 2022-11-12 LAB — URINE CULTURE: Culture: >=100000 — AB

## 2022-11-13 ENCOUNTER — Telehealth (HOSPITAL_BASED_OUTPATIENT_CLINIC_OR_DEPARTMENT_OTHER): Payer: Self-pay | Admitting: Emergency Medicine

## 2022-11-13 NOTE — Telephone Encounter (Signed)
Post ED Visit - Positive Culture Follow-up  Culture report reviewed by antimicrobial stewardship pharmacist: [facility] []  , Pharm.D. []  Enzo Bi, Pharm.D., BCPS AQ-ID []  , Pharm.D., BCPS []  Celedonio Miyamoto, Pharm.D., BCPS []  [place], Garvin Fila.D., BCPS, AAHIVP []  , Pharm.D., BCPS, AAHIVP []  Georgina Pillion, PharmD, BCPS []  , PharmD, BCPS []  [place], PharmD, BCPS [x]  [address] [place] [address], PharmD []  , PharmD, BCPS []  Estella Husk, PharmD  Pharmacy Team []  Lysle Pearl, PharmD []  , PharmD []  Phillips Climes, PharmD []  , Rph []  Agapito Games) , PharmD []  Estill Batten, PharmD []  , PharmD []  Mervyn Gay, PharmD []  , PharmD []  Vinnie Level, PharmD []  [facility], PharmD []  , PharmD []  Len Childs, PharmD   Positive urine culture Treated with Cephalexin, organism sensitive to the same and no further patient follow-up is required at this time.  Glendon Dunwoody 11/13/2022, 11:41 PM

## 2022-11-15 ENCOUNTER — Other Ambulatory Visit: Payer: Self-pay

## 2022-11-15 ENCOUNTER — Encounter (HOSPITAL_BASED_OUTPATIENT_CLINIC_OR_DEPARTMENT_OTHER): Payer: Self-pay

## 2022-11-15 ENCOUNTER — Emergency Department (HOSPITAL_BASED_OUTPATIENT_CLINIC_OR_DEPARTMENT_OTHER)
Admission: EM | Admit: 2022-11-15 | Discharge: 2022-11-15 | Discharge status: Against Medical Advice | Payer: Medicare Other | Attending: Emergency Medicine | Admitting: Emergency Medicine

## 2022-11-15 DIAGNOSIS — Z5321 Procedure and treatment not carried out due to patient leaving prior to being seen by health care provider: Secondary | ICD-10-CM | POA: Diagnosis not present

## 2022-11-15 DIAGNOSIS — M25511 Pain in right shoulder: Secondary | ICD-10-CM | POA: Insufficient documentation

## 2022-11-15 DIAGNOSIS — M79603 Pain in arm, unspecified: Secondary | ICD-10-CM | POA: Insufficient documentation

## 2022-11-15 DIAGNOSIS — W228XXA Striking against or struck by other objects, initial encounter: Secondary | ICD-10-CM | POA: Insufficient documentation

## 2022-11-15 DIAGNOSIS — M25539 Pain in unspecified wrist: Secondary | ICD-10-CM | POA: Diagnosis not present

## 2022-11-15 LAB — QUANTIFERON-TB GOLD PLUS: QuantiFERON-TB Gold Plus: NEGATIVE
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
## 2022-12-05 ENCOUNTER — Emergency Department (HOSPITAL_COMMUNITY)
Admission: EM | Admit: 2022-12-05 | Discharge: 2022-12-06 | Disposition: A | Discharge status: Home / Self Care | Payer: Medicare Other | Attending: Emergency Medicine | Admitting: Emergency Medicine

## 2022-12-05 ENCOUNTER — Other Ambulatory Visit: Payer: Self-pay

## 2022-12-05 ENCOUNTER — Emergency Department (HOSPITAL_COMMUNITY): Payer: Medicare Other

## 2022-12-05 ENCOUNTER — Encounter (HOSPITAL_COMMUNITY): Payer: Self-pay | Admitting: *Deleted

## 2022-12-05 DIAGNOSIS — M25511 Pain in right shoulder: Secondary | ICD-10-CM

## 2022-12-05 DIAGNOSIS — W19XXXA Unspecified fall, initial encounter: Secondary | ICD-10-CM | POA: Diagnosis not present
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
## 2022-12-05 NOTE — ED Provider Notes (Signed)
  [facility] EMERGENCY DEPARTMENT Provider Note   CSN: [id number] Arrival date & time: 12/05/22  1754     History {Add pertinent medical, surgical, social history, OB history to HPI:1} Chief Complaint  Patient presents with   Shoulder Pain    Dawn Mills is a 72 y.o. female.  HPI     Home Medications Prior to Admission medications   Medication Sig Start Date End Date Taking? Authorizing Provider  cephALEXin (KEFLEX) 500 MG capsule Take 1 capsule (500 mg total) by mouth 2 (two) times daily. 10/14/22   Hayden Rasmussen, MD  cephALEXin (KEFLEX) 500 MG capsule Take 1 capsule (500 mg total) by mouth 4 (four) times daily. 11/10/22   Leanord Asal K, DO  memantine (NAMENDA) 5 MG tablet Take 1 tablet (5 mg at night) for 2 weeks, then increase to 1 tablet (5 mg) twice a day 04/12/22   Rondel Jumbo, PA-C  ondansetron (ZOFRAN-ODT) 4 MG disintegrating tablet Take 1 tablet (4 mg total) by mouth every 4 (four) hours as needed for nausea or vomiting. Patient not taking: Reported on 04/12/2022 03/20/22   Charlesetta Shanks, MD  QUEtiapine (SEROQUEL) 50 MG tablet Take 1 tablet (50 mg total) by mouth at bedtime. 12/22/21 04/12/22  Wyvonnia Dusky, MD      Allergies    Patient has no known allergies.    Review of Systems   Review of Systems  Physical Exam Updated Vital Signs BP 119/87 (BP Location: Left Arm)   Pulse 75   Temp (!) 97.3 F (36.3 C) (Oral)   Resp 18   Ht 5\' 5"  (1.651 m)   Wt 59 kg   SpO2 98%   BMI 21.64 kg/m  Physical Exam  ED Results / Procedures / Treatments   Labs (all labs ordered are listed, but only abnormal results are displayed) Labs Reviewed - No data to display  EKG None  Radiology DG Shoulder Right  Result Date: 12/05/2022 CLINICAL DATA:  acute right shoulder pain EXAM: RIGHT SHOULDER - 2+ VIEW COMPARISON:  None Available. FINDINGS: There is no evidence of fracture or dislocation. Mild degenerative changes. Soft tissues are  unremarkable. Lower thoracic spine kyphoplasty. IMPRESSION: No acute displaced fracture or dislocation. Electronically Signed   By: Iven Finn M.D.   On: 12/05/2022 18:59    Procedures Procedures  {Document cardiac monitor, telemetry assessment procedure when appropriate:1}  Medications Ordered in ED Medications - No data to display  ED Course/ Medical Decision Making/ A&P                           Medical Decision Making  ***  {Document critical care time when appropriate:1} {Document review of labs and clinical decision tools ie heart score, Chads2Vasc2 etc:1}  {Document your independent review of radiology images, and any outside records:1} {Document your discussion with family members, caretakers, and with consultants:1} {Document social determinants of health affecting pt's care:1} {Document your decision making why or why not admission, treatments were needed:1} Final Clinical Impression(s) / ED Diagnoses Final diagnoses:  None    Rx / DC Orders ED Discharge Orders     None
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
## 2022-12-05 NOTE — ED Notes (Signed)
Pt to room, pt very nice and cooperative. Confusion noted

## 2022-12-06 MED ORDER — CYCLOBENZAPRINE HCL 10 MG PO TABS: 10.0000 mg | ORAL_TABLET | Freq: Every day | ORAL | 0 refills | Status: AC | PRN

## 2022-12-06 NOTE — Discharge Instructions (Signed)
You have been seen here for should pain right. I recommend taking over-the-counter pain medications like ibuprofen and/or Tylenol every 6 as needed.  Please follow dosage and on the back of bottle.  I also recommend applying heat to the area and stretching out the muscles as this will help decrease stiffness and pain.  I have given you information on exercises please follow. I have given you a muscle relaxer, take as prescribed, this medication make you drowsy do not consume alcohol or operate hemorrhage resulting this medication  Please follow-up with your primary care provider for further evaluation  Come back to the emergency department if you develop chest pain, shortness of breath, severe abdominal pain, uncontrolled nausea, vomiting, diarrhea.

## 2022-12-09 ENCOUNTER — Emergency Department (HOSPITAL_COMMUNITY)
Admission: EM | Admit: 2022-12-09 | Discharge: 2022-12-10 | Disposition: A | Discharge status: Home / Self Care | Payer: Medicare Other | Attending: Emergency Medicine | Admitting: Emergency Medicine

## 2022-12-09 DIAGNOSIS — Z046 Encounter for general psychiatric examination, requested by authority: Secondary | ICD-10-CM

## 2022-12-09 DIAGNOSIS — Z79899 Other long term (current) drug therapy: Secondary | ICD-10-CM | POA: Diagnosis not present

## 2022-12-09 DIAGNOSIS — F02818 Dementia in other diseases classified elsewhere, unspecified severity, with other behavioral disturbance: Secondary | ICD-10-CM | POA: Diagnosis present

## 2022-12-09 DIAGNOSIS — R4689 Other symptoms and signs involving appearance and behavior: Secondary | ICD-10-CM

## 2022-12-09 DIAGNOSIS — G309 Alzheimer's disease, unspecified: Secondary | ICD-10-CM

## 2022-12-09 LAB — COMPREHENSIVE METABOLIC PANEL
ALT: 11 U/L (ref 0–44)
AST: 20 U/L (ref 15–41)
Albumin: 3.6 g/dL (ref 3.5–5.0)
Alkaline Phosphatase: 88 U/L (ref 38–126)
Anion gap: 9 (ref 5–15)
BUN: 14 mg/dL (ref 8–23)
CO2: 25 mmol/L (ref 22–32)
Calcium: 8.6 mg/dL — ABNORMAL LOW (ref 8.9–10.3)
Chloride: 106 mmol/L (ref 98–111)
Creatinine, Ser: 0.74 mg/dL (ref 0.44–1.00)
GFR, Estimated: >60 mL/min (ref >60)
Glucose, Bld: 155 mg/dL — ABNORMAL HIGH (ref 70–99)
Potassium: 3.8 mmol/L (ref 3.5–5.1)
Sodium: 140 mmol/L (ref 135–145)
Total Bilirubin: 0.5 mg/dL (ref 0.3–1.2)
Total Protein: 6.4 g/dL — ABNORMAL LOW (ref 6.5–8.1)

## 2022-12-09 LAB — URINALYSIS, ROUTINE W REFLEX MICROSCOPIC
Bilirubin Urine: NEGATIVE
Glucose, UA: NEGATIVE mg/dL
Hgb urine dipstick: NEGATIVE
Ketones, ur: NEGATIVE mg/dL
Nitrite: NEGATIVE
Protein, ur: NEGATIVE mg/dL
Specific Gravity, Urine: 1.021 (ref 1.005–1.030)
pH: 5 (ref 5.0–8.0)

## 2022-12-09 LAB — CBC WITH DIFFERENTIAL/PLATELET
Abs Immature Granulocytes: 0.02 10*3/uL (ref 0.00–0.07)
Basophils Absolute: 0 10*3/uL (ref 0.0–0.1)
Basophils Relative: 0 %
Eosinophils Absolute: 0.1 10*3/uL (ref 0.0–0.5)
Eosinophils Relative: 2 %
HCT: 39.3 % (ref 36.0–46.0)
Hemoglobin: 12.7 g/dL (ref 12.0–15.0)
Immature Granulocytes: 0 %
Lymphocytes Relative: 30 %
Lymphs Abs: 2.1 10*3/uL (ref 0.7–4.0)
MCH: 31.7 pg (ref 26.0–34.0)
MCHC: 32.3 g/dL (ref 30.0–36.0)
MCV: 98 fL (ref 80.0–100.0)
Monocytes Absolute: 0.6 10*3/uL (ref 0.1–1.0)
Monocytes Relative: 8 %
Neutro Abs: 4.2 10*3/uL (ref 1.7–7.7)
Neutrophils Relative %: 60 %
Platelets: 179 10*3/uL (ref 150–400)
RBC: 4.01 MIL/uL (ref 3.87–5.11)
RDW: 12.5 % (ref 11.5–15.5)
WBC: 7 10*3/uL (ref 4.0–10.5)
nRBC: 0 % (ref 0.0–0.2)

## 2022-12-09 LAB — RAPID URINE DRUG SCREEN, HOSP PERFORMED
Amphetamines: NOT DETECTED
Barbiturates: NOT DETECTED
Benzodiazepines: NOT DETECTED
Cocaine: NOT DETECTED
Opiates: NOT DETECTED
Tetrahydrocannabinol: POSITIVE — AB

## 2022-12-09 LAB — ACETAMINOPHEN LEVEL: Acetaminophen (Tylenol), Serum: <10 ug/mL — ABNORMAL LOW (ref 10–30)

## 2022-12-09 LAB — ETHANOL: Alcohol, Ethyl (B): <10 mg/dL (ref <10)

## 2022-12-09 LAB — SALICYLATE LEVEL: Salicylate Lvl: <7 mg/dL — ABNORMAL LOW (ref 7.0–30.0)

## 2022-12-09 MED ORDER — ACETAMINOPHEN 325 MG PO TABS
650.0000 mg | ORAL_TABLET | ORAL | Status: DC | PRN
  Administered 2022-12-09: 650 mg via ORAL
  Filled 2022-12-09: qty 2

## 2022-12-09 MED ORDER — QUETIAPINE FUMARATE 50 MG PO TABS
50.0000 mg | ORAL_TABLET | Freq: Every day | ORAL | Status: DC
  Administered 2022-12-09: 50 mg via ORAL
  Filled 2022-12-09: qty 1

## 2022-12-09 MED ORDER — MEMANTINE HCL 5 MG PO TABS
5.0000 mg | ORAL_TABLET | Freq: Every day | ORAL | Status: DC
  Administered 2022-12-09: 5 mg via ORAL
  Filled 2022-12-09: qty 1

## 2022-12-09 NOTE — Discharge Instructions (Signed)
Sunday, by [facility] [address], [address] [place],  [place], [phone], 3.2 mi from [facility], call in  advance for appointment at 10:00am or at 4:00pm, must provide  valid photo ID Monday 9:30am-5:00pm [facility], [place], [phone], 0.9 mi from [facility], can  come four times per year, bring your photo ID and SS cards for  other residents of household, will make appointments for those  who work and need to come after 5pm 10:00am-12:00noon [place], [place], [place], [phone], 1.7 mi from [facility], can come once every 60 days per household, need  referral from [place], [employer], etc., bring photo  ID and SS card  10:00am-1:00pm [employer] the [facility],  [address], [place], [phone], 7.7 mi from  [facility], can come once every thirty days with a  referral from [place], [employer], [place], etc. -- each  referral good for six visits, bring photo ID  10:00am-1:00pm [address], [address], [place], [phone], 4.2 mi from [facility], can come once every 6 months, open to [facility]  residents, bring photo ID and copy of a current utility bill in your  name, please call first to verify that food is available 6:30pm-8:30pm [employer], [address] [address],  [phone], 3.2 mi from [facility], can come once  every 30 days, maximum 6 times per year, bring your photo ID and SS numbers for other residents of household Page 3 of 16 Monday by APPOINTMENT ONLY [employer] [place], [place], [place],  [phone], 2.5 mi from [facility], call in advance for  appointment between 10:00am-2:00pm, bring your photo ID and  SS cards for all residents of household, can come once every 3 months [address], [place], [address], [phone], 0.7 mi from [facility], call in advance for appointment,  can come once every 30 days, bring your photo ID and SS  cards for other residents of household Tuesday 9:00am-12:00noon [employer], [address] [address],  [phone], 1.3 mi from [facility], can come once  every 3 months, bring your photo ID and SS numbers for other residents of household 9:00am-1:00pm [place],  [place], [address], [phone]/Ext 1, 1.6 mi from [facility], can come once every two weeks 9:30am-5:00pm [employer], [place], [phone], 0.9 mi from [facility], can  come four times per year, bring your photo ID and SS cards for  other residents of household, will make appointments for those  who work and need to come after 5pm 10:00am-12:00noon [employer], [place], [place], [phone], 1.7 mi from [facility], can come once every 60 days per household, need  referral from [place], [employer], etc., bring photo  ID and SS card 10:00am-1:00pm [employer], [id number],  [phone], 3.8 mi from [facility], with referral from  [place], may come six times, 30 days apart, bring your photo ID  and SS cards for all residents of household [address] of [place] the [place], [address], [address], [phone], 7.7 mi from [facility], can come once every thirty days with a referral from  [place], [employer], [place], etc.- each referral good for  six visits, bring photo ID  10:00am-1:00pm [address], [address] [place], [phone], 4.2 mi from [facility], can come once every 6 months, open to [facility]  residents, bring photo ID and copy of a current utility bill in your  name, please call first to verify  that food is available 2:00pm-3:30pm [facility], [address],  [phone], [phone], 3.7 mi from [facility], can come  twelve times per year, one bag per family, bring photo ID  FIRST AND THIRD Tuesdays 10:00am-1:00pm, [address] [place], [place], [phone], 7.2 mi from [facility], can come once every 30 days Tuesday, WHEN FOOD IS AVAILABLE (call) 12:00noon-2:00pm [facility], [address], [address], [phone], 1.5 mi from  [facility], can come once every 30 days, bring photo ID  Tuesday, by [place], [place], [place],  [phone], 2.5 mi from [facility], call in advance for  appointment between 1:00pm-4:00pm, bring your photo ID and  SS cards for all residents of household, can come once every 3 months [place], [address] [place],  [phone], 5 mi from [facility], call one day ahead  for appointment the next day between 10:00am and 12:00noon,  can come once every 3 months, bring your photo ID and must      Please also utilize [facility]

## 2022-12-09 NOTE — ED Notes (Signed)
Pt continually asking for her watch. Multiple staff members have explained to pt that it is with the rest of her belongings and is secured. Pt becoming more and more upset. I advised pt that this is policy, I don't make the rules and that she cannot have her watch at this time. Pt continues to argue.

## 2022-12-09 NOTE — ED Notes (Signed)
Pt stated they were cold, writer provided pt with a warm blanket and half a cup of decaf coffee.

## 2022-12-09 NOTE — Progress Notes (Signed)
This CSW attempted to contact the pt's son, Lanny Hurst, without success. Unable to leave a voicemail due to the mailbox not being set up. TOC to continue to reach [place].

## 2022-12-09 NOTE — ED Notes (Signed)
Pt wanded by security and ambulated back to TCU room 30 without incident. Pts one belonging bag was transferred to locker #30.
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
## 2022-12-09 NOTE — Progress Notes (Addendum)
It appears TTS has been consulted to see pt. TOC will reengage if/when pt is cleared from TTS' care. TOC signing off.   It is noted that the pt has Medicare A/B and would require a 3 midnight inpatient stay in order to be placed into SNF. After reviewing the chart, the pt does not appear to have the qualifying 3 night stay, therefore, TOC is unable to place pt into SNF. If pt is cleared, TOC could potentially refer pt/son to [facility] to receive assistance with finding memory care/long term placement.

## 2022-12-09 NOTE — Progress Notes (Signed)
CSW Whitney from [facility] has called and stated that she can not help him because the patient has medicaid. Whitney also stated that she has referred this patient to DSS. CSW also heard from cheryl from [facility] reached out to the son. CSW added several resources.

## 2022-12-09 NOTE — ED Notes (Signed)
Pt attempted to call her son. Son did not answer and no voice mail was set up for her to leave one, will attempt to call later

## 2022-12-09 NOTE — ED Notes (Signed)
Pt's lunch has arrived, pt stated they were still full since breakfast. Pt's lunch left on bedside table
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
## 2022-12-09 NOTE — ED Notes (Signed)
Pt's dinner has arrived, pt sitting up and eating her dinner
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
## 2022-12-09 NOTE — ED Notes (Signed)
Change of commitment status found in pt chart completed by Dr Delane Ginger. [place].

## 2022-12-09 NOTE — ED Provider Notes (Signed)
  Physical Exam  BP (!) 101/58 (BP Location: Left Arm)   Pulse 71   Temp 98.6 F (37 C) (Oral)   Resp 16   SpO2 99%   Physical Exam  Procedures  Procedures  ED Course / MDM    Medical Decision Making Amount and/or Complexity of Data Reviewed Labs: ordered.  Risk OTC drugs.   Patient is being cleared from a psychiatric standpoint.  Transition of care is set up outpatient home health.  Will discharge with family member.       Davonna Belling, MD 12/09/22 2027

## 2022-12-09 NOTE — ED Provider Notes (Signed)
Emergency Medicine Observation Re-evaluation Note  Dawn Mills is a 72 y.o. female, seen on rounds today.  Pt initially presented to the ED for complaints of Psychiatric Evaluation Currently, the patient is awaiting TTS consult.  Physical Exam  BP (!) 152/86   Pulse 77   Temp 98.7 F (37.1 C) (Oral)   Resp 18   SpO2 99%  Physical Exam Alert alert in no acute distress  ED Course / MDM  EKG:   I have reviewed the labs performed to date as well as medications administered while in observation.  Recent changes in the last 24 hours include none.  Plan  Current plan is for TTS consult and possible placement.    Milton Ferguson, MD 12/09/22 1016

## 2022-12-09 NOTE — Progress Notes (Addendum)
CSW has reached out to the son, the son has agreed to take the home health with [facility]. The patient will pick up the mom at the time of DC. TOC will continue to follow up with this patient. The son has also asked this CSW to add resources to this patient chart those resources were added.
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
## 2022-12-10 NOTE — ED Notes (Signed)
Patient discharged off unit per provider. Patient alert, cooperative and s/s of distress. discharge information given to patient. Belongings given to patient. Patient ambulatory. Patient off unit in w/c, escorted by NT. Patient transported by family.

## 2022-12-10 NOTE — ED Notes (Signed)
Mrs. Stryker appears asleep with no signs of distress or sleep disturbance. Respirations appear easy as evidenced by the rise and fall of her chest. Sitter remains att bedside.
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
## 2022-12-17 ENCOUNTER — Emergency Department (HOSPITAL_COMMUNITY): Payer: Medicare Other

## 2022-12-17 ENCOUNTER — Encounter (HOSPITAL_COMMUNITY): Payer: Self-pay

## 2022-12-17 ENCOUNTER — Other Ambulatory Visit: Payer: Self-pay

## 2022-12-17 ENCOUNTER — Emergency Department (HOSPITAL_COMMUNITY)
Admission: EM | Admit: 2022-12-17 | Discharge: 2023-01-10 | Disposition: A | Discharge status: Skilled Nursing Facility | Payer: Medicare Other | Attending: Emergency Medicine | Admitting: Emergency Medicine

## 2022-12-17 DIAGNOSIS — M79601 Pain in right arm: Secondary | ICD-10-CM

## 2022-12-17 DIAGNOSIS — F02818 Dementia in other diseases classified elsewhere, unspecified severity, with other behavioral disturbance: Secondary | ICD-10-CM | POA: Diagnosis present

## 2022-12-17 DIAGNOSIS — M19011 Primary osteoarthritis, right shoulder: Secondary | ICD-10-CM | POA: Diagnosis not present

## 2022-12-17 DIAGNOSIS — G309 Alzheimer's disease, unspecified: Secondary | ICD-10-CM | POA: Diagnosis present

## 2022-12-17 DIAGNOSIS — F01A2 Vascular dementia, mild, with psychotic disturbance: Secondary | ICD-10-CM | POA: Diagnosis not present

## 2022-12-17 DIAGNOSIS — M25511 Pain in right shoulder: Secondary | ICD-10-CM | POA: Diagnosis present

## 2022-12-17 DIAGNOSIS — F0280 Dementia in other diseases classified elsewhere without behavioral disturbance: Secondary | ICD-10-CM

## 2022-12-17 MED ORDER — LIDOCAINE 5 % EX PTCH
1.0000 | MEDICATED_PATCH | CUTANEOUS | Status: DC
  Administered 2022-12-17 – 2022-12-18 (×2): 1 via TRANSDERMAL
  Filled 2022-12-17 (×2): qty 1

## 2022-12-17 MED ORDER — ACETAMINOPHEN 500 MG PO TABS
1000.0000 mg | ORAL_TABLET | Freq: Once | ORAL | Status: AC
  Administered 2022-12-17: 1000 mg via ORAL
  Filled 2022-12-17: qty 2

## 2022-12-17 NOTE — Progress Notes (Signed)
CSW has spoke to the provider, the patient is cleared to go, CSW has explained to the provider at this time the patient is good to go and has no further TOC needs will need to follow up with the resources provided.

## 2022-12-17 NOTE — ED Provider Notes (Signed)
[facility] Provider Note   CSN: [id number] Arrival date & time: 12/17/22  2145     History  Chief Complaint  Patient presents with   Arm Injury    Dawn Mills is a 72 y.o. female.  With PMH of Alzheimer's dementia brought in by EMS from home with right shoulder pain.  According to son who spoke with EMS, she had hit it on the door a couple of times and was complaining of pain at that area and called EMS.  EMS were concerned due to the conditions of the house and dirty appearance.  Patient arrives generally without complaints.  She is severely demented and unable to provide good HPI.  She does not remember why she is here.  She notes chronic pain in her right shoulder denies any numbness or tingling and is able to move it in all ranges of motion.  She is denying any chest pain abdominal pain nausea or vomiting.  She is talking about when she used to live with her friend and work with her friend and rambling anecdotal periods of her life.  Denying SI/HI/AVH.   Arm Injury      Home Medications Prior to Admission medications   Medication Sig Start Date End Date Taking? Authorizing Provider  cephALEXin (KEFLEX) 500 MG capsule Take 1 capsule (500 mg total) by mouth 2 (two) times daily. Patient not taking: Reported on 12/09/2022 10/14/22   Hayden Rasmussen, MD  cyclobenzaprine (FLEXERIL) 10 MG tablet Take 1 tablet (10 mg total) by mouth daily as needed for muscle spasms. Patient not taking: Reported on 12/09/2022 12/06/22   Marcello Fennel, PA-C  ibuprofen (ADVIL) 200 MG tablet Take 400 mg by mouth as needed for moderate pain.    [provider]  memantine (NAMENDA) 5 MG tablet Take 1 tablet (5 mg at night) for 2 weeks, then increase to 1 tablet (5 mg) twice a day Patient not taking: Reported on 12/09/2022 04/12/22   Rondel Jumbo, PA-C  ondansetron (ZOFRAN-ODT) 4 MG disintegrating tablet Take 1 tablet (4 mg total) by mouth every  4 (four) hours as needed for nausea or vomiting. Patient not taking: Reported on 04/12/2022 03/20/22   Charlesetta Shanks, MD  QUEtiapine (SEROQUEL) 50 MG tablet Take 1 tablet (50 mg total) by mouth at bedtime. Patient not taking: Reported on 12/09/2022 12/22/21 04/12/22  Wyvonnia Dusky, MD      Allergies    Patient has no known allergies.    Review of Systems   Review of Systems  Physical Exam Updated Vital Signs BP (!) 118/93   Pulse 85   Temp 98.3 F (36.8 C) (Oral)   Resp 18   SpO2 98%  Physical Exam Constitutional: Alert and orientedx2 person and place.  Slightly disheveled but no acute distress nontoxic Eyes: Conjunctivae are normal. ENT      Head: Normocephalic and atraumatic.      Nose: No congestion.      Mouth/Throat: Mucous membranes are moist.      Neck: No stridor. No midline ttp, stepoff or deformity Cardiovascular: S1, S2, regular rate and rhythm, equal palpable radial pulses.Warm and well perfused. Respiratory: Normal respiratory effort. Breath sounds are normal.  No chest wall tenderness or crepitus, O2 sat 98 on RA Gastrointestinal: Soft and nontender.  Musculoskeletal: Normal range of motion in all extremities.  Mild tenderness to palpation of the right posterior shoulder without erythema, no ecchymoses, no edema, no evidence of  trauma, full range of motion of shoulder intact, no deformities noted, right upper extremity neurovascularly intact.      Right lower leg: No tenderness or edema.      Left lower leg: No tenderness or edema. Neurologic: AAO x 2, rambling speech, equal strength 5 out of 5 bilateral upper and lower extremities, no facial droop, EOMI, PERRL. No gross focal neurologic deficits are appreciated. Skin: Skin is warm, dry and intact. No rash noted. Psychiatric: Mood and affect are normal. Speech and behavior are normal.  ED Results / Procedures / Treatments   Labs (all labs ordered are listed, but only abnormal results are displayed) Labs  Reviewed - No data to display  EKG None  Radiology No results found.  Procedures Procedures    Medications Ordered in ED Medications  lidocaine (LIDODERM) 5 % 1 patch (1 patch Transdermal Patch Applied 12/17/22 2227)  acetaminophen (TYLENOL) tablet 1,000 mg (1,000 mg Oral Given 12/17/22 2226)    ED Course/ Medical Decision Making/ A&P Clinical Course as of 12/17/22 2317  Fri Dec 17, 2022  2241 Attempted calling patient's home number listed in chart as well as patient's son phone number Ashley Royalty without answer. [VB]  [place] [VB]    Clinical Course User Index [VB] Elgie Congo, MD   {                            Medical Decision Making Patient sent from home for reported right shoulder pain after bumping into a door a couple of times.  She is demented at baseline and consistent with previous neurologic exams and has no localizing deficits or evidence of head injury so she does not require head imaging at this time.  Plain films reviewed of the chest and shoulder with no evidence of dislocation, fracture, pneumonia, pneumothorax or any other concerning acute findings.  Attempted calling patient's home number and son multiple times without answer.  Spoke with social worker Narda Amber who knows this patient and family members very well.  Patient receives social security income and is also followed by [facility]  home health for placement.  Unfortunately, this hospital has tried placing patient recently however her insurance does not qualify.  She would need to have a 3-day admission in order to have placement but she has no medical issues today requiring medical admission to the hospital.  Medically, patient is cleared.  She is followed by the home health agency as listed below above and our social workers.  Our social workers have provided more resources.  She is safe to be discharged home.  Amount and/or Complexity of Data Reviewed Radiology:  ordered.  Risk OTC drugs. Prescription drug management.    Final Clinical Impression(s) / ED Diagnoses Final diagnoses:  None    Rx / DC Orders ED Discharge Orders     None         Elgie Congo, MD 12/18/22 [phone]

## 2022-12-17 NOTE — ED Triage Notes (Signed)
Pt arrives EMS from home c/o right arm pain after hitting it on a door. Pt has full range of motion in all extremities. Pt has hx of dementia and aggressive behavior. 146/82 BP

## 2022-12-17 NOTE — Discharge Instructions (Addendum)
Take Tylenol and ibuprofen as needed for pain.  Follow-up with your primary care doctor regarding your visit to the ER today.  The x-ray showed no evidence of pneumonia or fracture of your ribs or arm.  Come back if any severe worsening uncontrolled pain, high fevers, worsening mental status, or any other symptoms concerning to you.  It was a pleasure caring for you today in the emergency department.  Please return to the emergency department for any worsening or worrisome symptoms.

## 2022-12-18 DIAGNOSIS — M19011 Primary osteoarthritis, right shoulder: Secondary | ICD-10-CM | POA: Diagnosis not present

## 2022-12-18 MED ORDER — QUETIAPINE FUMARATE 50 MG PO TABS
50.0000 mg | ORAL_TABLET | Freq: Every day | ORAL | Status: DC
  Administered 2022-12-18 – 2023-01-09 (×24): 50 mg via ORAL
  Filled 2022-12-18 (×24): qty 1

## 2022-12-18 MED ORDER — MEMANTINE HCL 5 MG PO TABS
5.0000 mg | ORAL_TABLET | Freq: Two times a day (BID) | ORAL | Status: DC
  Administered 2022-12-18 – 2023-01-10 (×48): 5 mg via ORAL
  Filled 2022-12-18 (×48): qty 1
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
## 2022-12-18 NOTE — ED Notes (Signed)
Called family members multiple times with no answer and mailbox is full

## 2022-12-18 NOTE — ED Notes (Signed)
Pt's brother on file states that he is not paying for their apt anymore and the son needs to be out by tomorrow. Pt has no where to go and he states that she needs to be placed.

## 2022-12-18 NOTE — ED Notes (Signed)
Called pt son and brother bc pt is being discharged. No answer. Will continue calling

## 2022-12-19 DIAGNOSIS — M19011 Primary osteoarthritis, right shoulder: Secondary | ICD-10-CM | POA: Diagnosis not present

## 2022-12-19 MED ORDER — IBUPROFEN 200 MG PO TABS
400.0000 mg | ORAL_TABLET | Freq: Once | ORAL | Status: AC
  Administered 2022-12-19: 400 mg via ORAL
  Filled 2022-12-19: qty 2

## 2022-12-19 NOTE — ED Notes (Signed)
Pt resting comfortably, ambulates without assistance to restroom, agreeable and pleasant.

## 2022-12-19 NOTE — Progress Notes (Addendum)
This CSW attempted to reach Dawn Mills, to inquire about the pt's Medicaid caseworker. No answer and unable to leave voicemail.  Addend @ 3:49 PM CSW received a message from [place] with [facility]. Malachy Mood informs that the pt's son is now homeless and that she will attempt to reach out to an [facility] in [place] to have them review the pt. TOC will attempt to identify the pt's medicaid caseworker at [place] on tomorrow.

## 2022-12-19 NOTE — ED Notes (Signed)
Pt resting comfortably

## 2022-12-19 NOTE — ED Provider Notes (Signed)
Emergency Medicine Observation Re-evaluation Note  Dawn Mills is a 72 y.o. female, seen on rounds today.  Pt initially presented to the ED for complaints of Arm Injury Currently, the patient is no acute distress.  Physical Exam  BP 124/62   Pulse 76   Temp 97.7 F (36.5 C) (Oral)   Resp 16   SpO2 99%  Physical Exam   ED Course / MDM  EKG:   I have reviewed the labs performed to date as well as medications administered while in observation.  Recent changes in the last 24 hours include reviewed notes from TOC.  Unfortunate case based on their documentation..  Plan  Current plan is for placement by TOC.    Lacretia Leigh, MD 12/19/22 [phone]

## 2022-12-19 NOTE — ED Notes (Signed)
Pt moved to hospital bed. Pt appreciative.

## 2022-12-19 NOTE — ED Notes (Signed)
Pt enjoyed all of her breakfast tray. Was provided additional apple juice

## 2022-12-20 DIAGNOSIS — M19011 Primary osteoarthritis, right shoulder: Secondary | ICD-10-CM | POA: Diagnosis not present

## 2022-12-20 NOTE — ED Notes (Signed)
Patient is alert.  She is oriented to place and person but expressed confusion and being upset that she is here and has lost her home and her boyfriend.  Patient is cooperative.  Skin is warm and dry.  Lungs are clear.  She is complaining of pain in the right shoulder.  No bruising nor swelling noted.  Patient has been ambulatory in her room.  She denies SI/HI

## 2022-12-20 NOTE — ED Provider Notes (Signed)
Emergency Medicine Observation Re-evaluation Note  Dawn Mills is a 72 y.o. female, seen on rounds today.  Pt initially presented to the ED for complaints of Arm Injury Currently, the patient is resting comfortably.  Physical Exam  BP 124/62   Pulse 76   Temp 97.7 F (36.5 C) (Oral)   Resp 16   Ht 5\' 5"  (1.651 m)   Wt 59 kg   SpO2 99%   BMI 21.63 kg/m  Physical Exam General: NAD   ED Course / MDM  EKG:   I have reviewed the labs performed to date as well as medications administered while in observation.  Recent changes in the last 24 hours include no acute events reported.  Plan  Current plan is for placement.    Valarie Merino, MD 12/20/22 [phone]

## 2022-12-20 NOTE — Progress Notes (Addendum)
This CSW has attempted to contact DSS several times. Will continue trying.  Addend @ 10:37 AM Medicaid caseworker: Laurann Montana Phone: [phone]  Addend @ 10:47 AM This CSW attempted to contact the pt's Medicaid caseworker and left a HIPAA Compliant voicemail.

## 2022-12-20 NOTE — Progress Notes (Signed)
This CSW has sent out referrals to [facility], [facility], and [facility]. This CSW received an email back from [facility] stating their Interior and spatial designer will need to review.

## 2022-12-20 NOTE — NC FL2 (Signed)
[place] LEVEL OF CARE FORM     IDENTIFICATION  Patient Name: Dawn Mills Birthdate: 08/18/1951 Sex: female Admission Date (Current Location): 12/17/2022  [place] and [place] Number:  Kathleen Argue [id number] [place] and Address:  [facility],  [address], [place]      Provider Number: [id number]  Attending Physician Name and Address:  Default, Provider, MD  Relative Name and Phone Number:  Mariea Clonts (brother) -  [phone]    Current Level of Care: Hospital Recommended Level of Care: Memory Care Prior Approval Number:    Date Approved/Denied:   PASRR Number:    Discharge Plan: Other (Comment) (Memory Care)    Current Diagnoses: Patient Active Problem List   Diagnosis Date Noted   Involuntary commitment 10/07/2022   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 ([place]) 04/13/2022   Major neurocognitive disorder due to Alzheimer's disease, with behavioral disturbance ([place]) 12/25/2021    Orientation RESPIRATION BLADDER Height & Weight     Self  Normal Continent Weight: 130 lb (59 kg) Height:  5\' 5"  (165.1 cm)  BEHAVIORAL SYMPTOMS/MOOD NEUROLOGICAL BOWEL NUTRITION STATUS  Other (Comment) (Patient has been pleasant while in ED.)   Continent Diet (Regular)  AMBULATORY STATUS COMMUNICATION OF NEEDS Skin   Independent Verbally Normal                       Personal Care Assistance Level of Assistance  Bathing, Feeding, Dressing Bathing Assistance: Limited assistance Feeding assistance: Independent Dressing Assistance: Limited assistance     Functional Limitations Info  Sight, Hearing, Speech Sight Info: Adequate Hearing Info: Adequate Speech Info: Adequate    SPECIAL CARE FACTORS FREQUENCY                       Contractures Contractures Info: Not present    Additional Factors Info  Code Status, Allergies, Psychotropic Code Status Info: Full Allergies Info:  None Psychotropic Info: Seroquel         Current Medications (12/20/2022):  This is the current hospital active medication list Current Facility-Administered Medications  Medication Dose Route Frequency Provider Last Rate Last Admin   memantine (NAMENDA) tablet 5 mg  5 mg Oral BID Georgina Snell C, MD   5 mg at 12/20/22 1018   QUEtiapine (SEROQUEL) tablet 50 mg  50 mg Oral QHS Georgina Snell C, MD   50 mg at 12/19/22 2144   Current Outpatient Medications  Medication Sig Dispense Refill   cephALEXin (KEFLEX) 500 MG capsule Take 1 capsule (500 mg total) by mouth 2 (two) times daily. (Patient not taking: Reported on 12/09/2022) 14 capsule 0   cyclobenzaprine (FLEXERIL) 10 MG tablet Take 1 tablet (10 mg total) by mouth daily as needed for muscle spasms. (Patient not taking: Reported on 12/09/2022) 20 tablet 0   ibuprofen (ADVIL) 200 MG tablet Take 400 mg by mouth as needed for moderate pain.     memantine (NAMENDA) 5 MG tablet Take 1 tablet (5 mg at night) for 2 weeks, then increase to 1 tablet (5 mg) twice a day (Patient not taking: Reported on 12/09/2022) 60 tablet 11   ondansetron (ZOFRAN-ODT) 4 MG disintegrating tablet Take 1 tablet (4 mg total) by mouth every 4 (four) hours as needed for nausea or vomiting. (Patient not taking: Reported on 04/12/2022) 20 tablet 0   QUEtiapine (SEROQUEL) 50 MG tablet Take 1 tablet (50 mg total) by mouth at bedtime. (Patient not taking: Reported on  12/09/2022) 30 tablet 1     Discharge Medications: Please see discharge summary for a list of discharge medications.  Relevant Imaging Results:  Relevant Lab Results:   Additional Information [id number]  Kimber Relic, LCSW

## 2022-12-21 DIAGNOSIS — M19011 Primary osteoarthritis, right shoulder: Secondary | ICD-10-CM | POA: Diagnosis not present

## 2022-12-21 NOTE — Progress Notes (Addendum)
This CSW spoke with Candice at [facility]. Referral is under review.  [facility] requesting virtual assessment. TOC following.  Addend @ 11:15 AM Will complete virtual assessment at 2:30 PM. [phone].

## 2022-12-21 NOTE — Progress Notes (Signed)
The patient's son text back asking about may he E-sign documents for his mother. This CSW explained to him through text that the admin from [facility] will reach out to him as he should answer the call to be able to plan next steps for his mom/ patient.
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
## 2022-12-21 NOTE — ED Notes (Signed)
Resting in bed behavior self controlled affect bright on approach mood is pleasantly confused.
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
## 2022-12-21 NOTE — ED Notes (Signed)
Pt was provided with a snack.

## 2022-12-21 NOTE — ED Provider Notes (Signed)
Emergency Medicine Observation Re-evaluation Note  Dawn Mills is a 72 y.o. female, seen on rounds today.  Pt initially presented to the ED for complaints of Arm Injury Currently, the patient is none.  Physical Exam  BP 139/83   Pulse 90   Temp 98.2 F (36.8 C) (Oral)   Resp 16   Ht 5\' 5"  (1.651 m)   Wt 59 kg   SpO2 98%   BMI 21.63 kg/m  Physical Exam General: resting comfortably, NAD Lungs: normal WOB Psych: currently calm and resting  ED Course / MDM  EKG:   I have reviewed the labs performed to date as well as medications administered while in observation.  Recent changes in the last 24 hours include none.  Plan  Current plan is for placement.    Lorelle Gibbs, DO 12/21/22 0830

## 2022-12-21 NOTE — Progress Notes (Signed)
@  late-note   CSW spoke to the director at [facility], the facility has not officially offered this patient a bed. The facility would like to see about the patients finances. The Admin intake would also like to speak to the patients son about signing the patient into the facility as the patient is not deemed competent enough to do so per the facilities assessment today which was held at 2:30 pm.The facility  will make a decision once all these factors are reviewed. AT this time TOC will continue to make efforts to reach out to more facilities. TOC will update as they come.

## 2022-12-21 NOTE — Progress Notes (Signed)
CSW attempted to contact patients son to update him on next steps for his mom's possible placement. CSW also reached out to the patient other son who lives out of state both sons did not answer. TOC will continue to reach out so that we can move forward with next steps.

## 2022-12-21 NOTE — Progress Notes (Signed)
CSW spoke to the lady at [employer] to do the interview with Dawn Mills. The interview went well, The patient was asked to answer a few questions. The patient was able to walk around as well. The facility stated that they would need to speak with the son about finance as well as signing paper work over for his mom. TOC will continue to follow.

## 2022-12-22 DIAGNOSIS — M19011 Primary osteoarthritis, right shoulder: Secondary | ICD-10-CM | POA: Diagnosis not present

## 2022-12-22 NOTE — ED Notes (Signed)
Remains pleasantly confused blames son for her being a patient here and is very angry with him.

## 2022-12-22 NOTE — ED Notes (Signed)
Alert restless and frequently pushing nurse call button unintentionally and became verbally aggressive when redirected.

## 2022-12-22 NOTE — ED Notes (Signed)
Pt is sleeping with no sign of distress or disturbed sleep patterns respirations are easy.

## 2022-12-22 NOTE — Progress Notes (Addendum)
This CSW received the following email from [facility]:   "I've done a call with the son yesterday...,At this time I'm going to decline. Son is displaced and  in charge of his mother's finances. It's concerning for my team and I."  TOC will continue to work on other placement options.

## 2022-12-22 NOTE — ED Notes (Signed)
Patient has been alert this shift. Patient is medication compliant. Patient is confused and needs redirection. Patient is cooperative with redirection. Patient labile with emotions at times.

## 2022-12-23 DIAGNOSIS — M19011 Primary osteoarthritis, right shoulder: Secondary | ICD-10-CM | POA: Diagnosis not present

## 2022-12-23 MED ORDER — ACETAMINOPHEN 325 MG PO TABS
650.0000 mg | ORAL_TABLET | ORAL | Status: DC | PRN
  Administered 2022-12-23 – 2023-01-09 (×8): 650 mg via ORAL
  Filled 2022-12-23 (×8): qty 2

## 2022-12-23 MED ORDER — ZIPRASIDONE HCL 20 MG PO CAPS
40.0000 mg | ORAL_CAPSULE | ORAL | Status: DC | PRN
  Filled 2022-12-23: qty 2

## 2022-12-23 NOTE — ED Notes (Signed)
Pt. Refused vitals.

## 2022-12-23 NOTE — Progress Notes (Signed)
CSW has followed up with Dawn Mills at the memory care as she was not availle this CSW left name and phone number title only to have Dawn Mills follow up. TOC will continue to follow.

## 2022-12-23 NOTE — ED Notes (Signed)
Attempted to call son to notifiy of need to escort mother back to her room  no answer and voice mail was not setup.

## 2022-12-23 NOTE — Progress Notes (Addendum)
This CSW attempted to contact [facility] by phone without success and unable to leave a voicemail. Sent text requesting a call back.    Addend @ 1:24 PM This CSW contacted Lanny Hurst and he states he will sign over his mothers finances and also sign her into a facility.    Addend @ 2:01 PM This CSW has [facility] to follow up on referral sent on 1/22. TOC to [facility] [phone]- Candice). TOC to fax out referral to [facility] at [facility].
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
## 2022-12-24 DIAGNOSIS — M19011 Primary osteoarthritis, right shoulder: Secondary | ICD-10-CM | POA: Diagnosis not present

## 2022-12-24 NOTE — ED Provider Notes (Signed)
Emergency Medicine Observation Re-evaluation Note  Dawn Mills is a 72 y.o. female, seen on rounds today.  Pt initially presented to the ED for complaints of Arm Injury Currently, the patient is resting.   Physical Exam  BP 112/69 (BP Location: Left Arm)   Pulse 71   Temp 97.6 F (36.4 C) (Oral)   Resp 18   Ht 5\' 5"  (1.651 m)   Wt 59 kg   SpO2 98%   BMI 21.63 kg/m  Physical Exam General: nad Lungs: no resp distress Psych: calm  ED Course / MDM  EKG:   I have reviewed the labs performed to date as well as medications administered while in observation.  Recent changes in the last 24 hours include overnight was aggressive per nursing documentation. Intermittently refusing vitals, nursing care.   Plan  Current plan is for placement.    Jeanell Sparrow, DO 12/24/22 [phone]

## 2022-12-24 NOTE — Progress Notes (Signed)
This CSW contacted [facility] to speak with Candice and was informed that she will not be back until Monday and that there is no one else covering admissions.    This CSW emailed referral to [place] at [facility] for review.

## 2022-12-24 NOTE — ED Notes (Signed)
Fair interaction with staff no aggressive behavior noted.

## 2022-12-25 DIAGNOSIS — F01A2 Vascular dementia, mild, with psychotic disturbance: Secondary | ICD-10-CM

## 2022-12-25 DIAGNOSIS — M19011 Primary osteoarthritis, right shoulder: Secondary | ICD-10-CM | POA: Diagnosis not present

## 2022-12-25 MED ORDER — LORAZEPAM 1 MG PO TABS
1.0000 mg | ORAL_TABLET | ORAL | Status: AC | PRN
  Administered 2022-12-30: 1 mg via ORAL
  Filled 2022-12-25: qty 1

## 2022-12-25 MED ORDER — MENTHOL 3 MG MT LOZG
1.0000 | LOZENGE | OROMUCOSAL | Status: DC | PRN
  Administered 2022-12-30: 3 mg via ORAL
  Filled 2022-12-25: qty 9

## 2022-12-25 MED ORDER — RISPERIDONE 0.5 MG PO TBDP
2.0000 mg | ORAL_TABLET | Freq: Three times a day (TID) | ORAL | Status: DC | PRN
  Administered 2022-12-25 – 2023-01-09 (×4): 2 mg via ORAL
  Filled 2022-12-25 (×4): qty 4

## 2022-12-25 MED ORDER — ZIPRASIDONE MESYLATE 20 MG IM SOLR: 20.0000 mg | INTRAMUSCULAR | Status: DC | PRN

## 2022-12-25 NOTE — ED Notes (Signed)
Pt agitated and asking to leave so they can see their family. Explained to pt they are finding placement for them. Pt stating "I am fine. I have no money to give you. Why are you keeping me here?".
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
## 2022-12-25 NOTE — Progress Notes (Signed)
Pt is being reviewed by [facility], [facility], and [facility].

## 2022-12-25 NOTE — ED Notes (Signed)
Pt continues to be agitated, attempted to give PO meds for sedatives. Pt refused.

## 2022-12-25 NOTE — ED Provider Notes (Signed)
Emergency Medicine Observation Re-evaluation Note  Nelline Lio is a 72 y.o. female, seen on rounds today.  Pt initially presented to the ED for complaints of Arm Injury Currently, the patient is medically cleared awaiting placement. Sleeping at time of my exam.  Physical Exam  BP 117/70 (BP Location: Left Arm)   Pulse 70   Temp (!) 97.5 F (36.4 C) (Oral)   Resp 18   Ht 5\' 5"  (1.651 m)   Wt 59 kg   SpO2 98%   BMI 21.63 kg/m  Physical Exam General: nad Cardiac: rr Lungs: even unalbored Psych: na  ED Course / MDM  EKG:   I have reviewed the labs performed to date as well as medications administered while in observation.  Recent changes in the last 24 hours include none.  Plan  Current plan is for social work placement.    Gareth Morgan, MD 12/25/22 2259

## 2022-12-25 NOTE — ED Notes (Signed)
Pt's brother called and phone given to pt in an attempt at deescalating and redirection

## 2022-12-25 NOTE — ED Notes (Signed)
Pt asking for candy to suck on, pt told the hospital does not carry candy for pts to have.

## 2022-12-25 NOTE — ED Notes (Signed)
Pt calm, but tearful. Quietly sitting in rm.

## 2022-12-26 DIAGNOSIS — M19011 Primary osteoarthritis, right shoulder: Secondary | ICD-10-CM | POA: Diagnosis not present

## 2022-12-26 NOTE — ED Notes (Signed)
Pt showered and changed into clean clothes. Bed was changed into fresh linen.

## 2022-12-26 NOTE — ED Provider Notes (Signed)
Emergency Medicine Observation Re-evaluation Note  Dawn Mills is a 72 y.o. female, seen on rounds today.  Pt initially presented to the ED for complaints of Arm Injury Currently, the patient is medically and psychiatric cleared, awaiting memory care placement.  Physical Exam  BP 122/72 (BP Location: Left Arm)   Pulse 71   Temp (!) 97.5 F (36.4 C) (Oral)   Resp 16   Ht 5\' 5"  (1.651 m)   Wt 59 kg   SpO2 98%   BMI 21.63 kg/m  Physical Exam General: NAD Cardiac: RR Lungs: even unlabored Psych: na  ED Course / MDM  EKG:   I have reviewed the labs performed to date as well as medications administered while in observation.  Recent changes in the last 24 hours include none.  Plan  Current plan is for placement in a memory care facility.    Gareth Morgan, MD 12/27/22 0000

## 2022-12-27 DIAGNOSIS — M19011 Primary osteoarthritis, right shoulder: Secondary | ICD-10-CM | POA: Diagnosis not present

## 2022-12-27 NOTE — Progress Notes (Signed)
CSW reached out to [facility], This facility does not offer memory care. [facility] No answer left VM [facility] Left VM. [facility] No answer Left VM. Director email is [email or web address]

## 2022-12-27 NOTE — ED Notes (Signed)
Increased Acuity to Level 3 due to safety sitter present.

## 2022-12-27 NOTE — Progress Notes (Addendum)
This CSW followed up with Candice at [facility]. Candace states she just got in for the day and will call back.   [place] who reported Admissions is not in right now and to call back between 10-10:30.  Addend @ 8:52 AM Referral faxed to [facility].   Addend @ 9:10 AM Referral faxed to [facility].  Addend @ 12:19 PM [facility] - Pamala Hurry is helping serve and will call back.  [place] busy [facility] - Left voicemail.  [facility]- Left voicemail.

## 2022-12-27 NOTE — ED Provider Notes (Signed)
Emergency Medicine Observation Re-evaluation Note  Dawn Mills is a 72 y.o. female, seen on rounds today.  Pt initially presented to the ED for complaints of Arm Injury Currently, the patient is waiting placement.  Physical Exam  BP 131/61 (BP Location: Left Arm)   Pulse 70   Temp 97.7 F (36.5 C) (Oral)   Resp 15   Ht 1.651 m (5\' 5" )   Wt 59 kg   SpO2 95%   BMI 21.63 kg/m  Physical Exam   ED Course / MDM  EKG:   I have reviewed the labs performed to date as well as medications administered while in observation.  Recent changes in the last 24 hours include none.  Plan  Current plan is for placement.    Lacretia Leigh, MD 12/27/22 [phone]

## 2022-12-27 NOTE — Progress Notes (Signed)
Chaplain engaged in an initial visit with Dawn Mills.  Chaplain provided a compassionate presence and listening as Dawn Mills shared about not remembering things and feeling fearful.  Dawn Mills expressed that Dawn Mills has nightmares at night and wakes up scared.  Dawn Mills also recognizes that Dawn Mills has been forgetting a lot of things.  During visit, Dawn Mills noted how bad Dawn Mills feels in sometimes forgetting her kids.  Chaplain offered support.   Chaplain offered prayer with Dawn Mills normalizing her forgetting things and uplifting this new process and journey of life Dawn Mills is in.   Chaplain Carlous Olivares, MDiv   12/27/22 0900  Spiritual Encounters  Type of Visit Initial  Spiritual Framework  Presenting Themes Rituals and practive;Meaning/purpose/sources of inspiration  Needs/Challenges/Barriers Memory  Patient Stress Factors Loss of control  Interventions  Spiritual Care Interventions Made Prayer;Established relationship of care and support;Compassionate presence;Reflective listening;Normalization of emotions  Intervention Outcomes  Outcomes Connection to spiritual care

## 2022-12-28 DIAGNOSIS — M19011 Primary osteoarthritis, right shoulder: Secondary | ICD-10-CM | POA: Diagnosis not present

## 2022-12-28 MED ORDER — DOCUSATE SODIUM 100 MG PO CAPS
100.0000 mg | ORAL_CAPSULE | Freq: Once | ORAL | Status: AC
  Administered 2022-12-28: 100 mg via ORAL
  Filled 2022-12-28: qty 1

## 2022-12-28 NOTE — Progress Notes (Addendum)
Referral sent via email to [facility].   Addend @ 2:41 Sent new referral to Development worker, international aid at [facility].

## 2022-12-28 NOTE — ED Notes (Signed)
Pt having difficulty with bowel movement, provider notified.

## 2022-12-28 NOTE — ED Notes (Signed)
Pt is currently resting, sitter at bedside.

## 2022-12-28 NOTE — ED Notes (Signed)
rEMAINS ASLEEP NO DISTRESS NOTED

## 2022-12-28 NOTE — ED Provider Notes (Signed)
Emergency Medicine Observation Re-evaluation Note  Dawn Mills is a 72 y.o. female, seen on rounds today.  Pt initially presented to the ED for complaints of Arm Injury Currently, the patient is resting.  Physical Exam  BP 124/74 (BP Location: Right Arm)   Pulse 67   Temp 97.6 F (36.4 C) (Oral)   Resp 18   Ht 1.651 m (5\' 5" )   Wt 59 kg   SpO2 98%   BMI 21.63 kg/m  Physical Exam    ED Course / MDM  EKG:   I have reviewed the labs performed to date as well as medications administered while in observation.  Recent changes in the last 24 hours include none.  Plan  Current plan is for placement.    Lacretia Leigh, MD 12/28/22 [phone]

## 2022-12-28 NOTE — ED Notes (Signed)
Fair interaction with staff pt is pleasantly confused but redirectable.

## 2022-12-28 NOTE — ED Notes (Signed)
Sleeping with no signs of distress or disturbed sleep patterns,  respiration are easy and skin color WNL.

## 2022-12-28 NOTE — ED Notes (Signed)
Pt c/o right arm pain from stretching, given hot pack for discomfort.

## 2022-12-28 NOTE — Progress Notes (Signed)
This CSW has sent out the patient's referral to [facility]. They are reviewing the patient at this time.

## 2022-12-28 NOTE — Progress Notes (Addendum)
CSW has faxed out referrals to [facility] fax number [phone]. CSW also faxed out a referral to [facility] at [phone]. TOC will continue to follow up with these referrals.  CSW reached out to [facility] with [facility], per candice patient can not be selected into facility without special assistance medicaid.   CSW reached out to the patient's medicaid working, however there was no answer. CSW left HIPAA V/M. TOC will continue to follow up with facilities and work on getting the patients medicaid switched over.   [facility] [phone] [facility]

## 2022-12-29 DIAGNOSIS — M19011 Primary osteoarthritis, right shoulder: Secondary | ICD-10-CM | POA: Diagnosis not present

## 2022-12-29 NOTE — ED Notes (Signed)
Currently sleeping with no signs of distress respirations appear easy unlabored.

## 2022-12-29 NOTE — Progress Notes (Signed)
CSW has reached out to DSS 4x times and no answer not even a VM/ toc will continue to reach out to DSS for this patient's Insurance account manager.

## 2022-12-29 NOTE — ED Notes (Signed)
Pt is asleep with no signs of distress or disturbance respirations are easy.

## 2022-12-29 NOTE — ED Notes (Signed)
Patient alert this shift. Patient has been cooperative with care and redirection. Patient confused and rambling at times.  Patient medication compliant. Patient ambulatory and can do ADLs.

## 2022-12-29 NOTE — ED Notes (Signed)
Pleasant with staff during interaction mild confusion with occasional rambling conversation affect is bright.

## 2022-12-29 NOTE — Progress Notes (Addendum)
This CSW followed up with the following facilities:  [facility]- No female beds. [facility] - faxed over referral again.  [place], Patent attorney. Addend @ 2:22 PM - Kayla reports she is away from the office and will circle back on Friday.  [facility]- left voicemail. [facility]- Spoke with Ron who is the ED and was informed that the pt will have to begin the process of [place].   This CSW spoke with Lanny Hurst who reports he is unable to remember whether or not the pt has [place] Medicaid. Lanny Hurst states he was told by [facility] in the earlier part of 2023 to apply for it for the pt;however, he doesn't remember if he did or not. TOC to attempt to contact Laurann Montana, Medicaid caseworker or DSS to get a hold of her supervisor due to failed attempts to contact her.   Addend @ 2:40 PM Attempted to contact Laurann Montana without success. Currently on hold for DSS' main line.

## 2022-12-29 NOTE — Progress Notes (Signed)
This writer was on hold with DSS for 20 minutes with no answer. TOC to continue to try.

## 2022-12-30 DIAGNOSIS — M19011 Primary osteoarthritis, right shoulder: Secondary | ICD-10-CM | POA: Diagnosis not present

## 2022-12-30 LAB — URINALYSIS, ROUTINE W REFLEX MICROSCOPIC
Bacteria, UA: NONE SEEN
Bilirubin Urine: NEGATIVE
Glucose, UA: NEGATIVE mg/dL
Hgb urine dipstick: NEGATIVE
Ketones, ur: NEGATIVE mg/dL
Nitrite: NEGATIVE
Protein, ur: NEGATIVE mg/dL
Specific Gravity, Urine: 1.008 (ref 1.005–1.030)
pH: 6 (ref 5.0–8.0)

## 2022-12-30 NOTE — ED Provider Notes (Signed)
Emergency Medicine Observation Re-evaluation Note  Dawn Mills is a 72 y.o. female with a history of Alzheimer's dementia, seen on rounds today.  Pt initially presented to the ED for complaints of Arm Injury Currently, the patient is stable and awaiting placement.  Physical Exam  BP (!) 109/59 (BP Location: Left Arm)   Pulse 65   Temp 97.6 F (36.4 C) (Oral)   Resp 18   Ht 5\' 5"  (1.651 m)   Wt 59 kg   SpO2 97%   BMI 21.63 kg/m  Physical Exam General: Resting comfortably in stretcher Lungs: Normal work of breathing Psych: Alert spine appropriately to commands but appears to be confused  ED Course / MDM  EKG:   I have reviewed the labs performed to date as well as medications administered while in observation.  Recent changes in the last 24 hours include social work attempted to reach out to [place] and multiple facilities for placement without success.  Plan  Current plan is for placement. #Confusion - May be 2/2 dementia or hospital induced delirium - Check UA for UTI   Fransico Meadow, MD 12/30/22 [phone]

## 2022-12-30 NOTE — Progress Notes (Addendum)
Was provided the supervisor's name over Medicaid: Sharmon Leyden, by program manager, Kevin Fenton. Attempted to call Erica's number [phone]) listed on DSS' website and reached Josph Macho [facility]) voicemail. Left HIPAA Compliant voicemail.  Addend @ 2:53 PM Received a call back from Ms. Green at [place] [place] who reports the pt's son was last at [place] on 12/20 and met with th pt's caseworker. It appears he requested an FL2 and was provided information about [facility]. This CSW informed Ms. Green that the pt is now without a home and is in the ED while we are attempting to place her. TOC needs to know if the pt has [place] Medicaid. The son, Lanny Hurst, should be updated on whether or not she does. If she does not, Lanny Hurst will need to go to DSS and apply for it on her behalf.

## 2022-12-30 NOTE — ED Notes (Signed)
Pt is sleeping with no signs of distress or broken sleep patterns. Respirations are non labored skin color is WNL.

## 2022-12-31 DIAGNOSIS — M19011 Primary osteoarthritis, right shoulder: Secondary | ICD-10-CM | POA: Diagnosis not present

## 2022-12-31 NOTE — Progress Notes (Addendum)
This CSW attempted to contact Medicaid Caseworker, Laurann Montana, without success. Left HIPAA Compliant voicemail.  Laurann Montana - [phone]  Attempted to contact DSS' main line without success.

## 2022-12-31 NOTE — ED Notes (Signed)
Patient has been alert this shift. Patient did sleep this shift. Patient medication compliant. Patient confused. Patient quiet this shift. Patient ambulatory but unsteady at times. Patient needs assist with ADLs.

## 2022-12-31 NOTE — ED Provider Notes (Signed)
Emergency Medicine Observation Re-evaluation Note  Dawn Mills is a 72 y.o. female with a history of Alzheimer's dementia, seen on rounds today.  Pt initially presented to the ED for complaints of Arm Injury Currently, the patient is stable and awaiting placement.  Physical Exam  BP 111/60 (BP Location: Right Arm)   Pulse 90   Temp 98.5 F (36.9 C) (Oral)   Resp 20   Ht 5\' 5"  (1.651 m)   Wt 59 kg   SpO2 98%   BMI 21.63 kg/m  Physical Exam General: Resting comfortably in stretcher Lungs: Normal work of breathing Psych: Alert spine appropriately to commands but appears to be confused  ED Course / MDM  EKG:   I have reviewed the labs performed to date as well as medications administered while in observation.  Recent changes in the last 24 hours include social work attempted to reach out to [place] and multiple facilities for placement without success.  Plan  Current plan is for placement.  UA was obtained yesterday, negative for UTI on my independent interpretation.      Deno Etienne, DO 12/31/22 [phone]

## 2022-12-31 NOTE — Progress Notes (Addendum)
Social worker Dawn Mills has called back [phone].and has reported that the son would have to apply for special assistance. This CSW has also reached out to the son as he has not answered.   Addend at 5:46 update note.  CSW has not yet heard from the son.

## 2023-01-01 DIAGNOSIS — M19011 Primary osteoarthritis, right shoulder: Secondary | ICD-10-CM | POA: Diagnosis not present

## 2023-01-01 NOTE — ED Notes (Signed)
Pt. Is laying in the bed at this time. No complaints or concerns.

## 2023-01-01 NOTE — ED Provider Notes (Signed)
Emergency Medicine Observation Re-evaluation Note  Dawn Mills is a 72 y.o. female, seen on rounds today.  Pt initially presented to the ED for complaints of Arm Injury Currently, the patient is sleeping.  Physical Exam  BP (!) 109/58 (BP Location: Left Arm)   Pulse 72   Temp (!) 97.5 F (36.4 C) (Oral)   Resp 16   Ht 5\' 5"  (1.651 m)   Wt 59 kg   SpO2 94%   BMI 21.63 kg/m  Physical Exam General: Sleeping Cardiac: Extremities well-perfused Lungs: Breathing is unlabored Psych: Deferred  ED Course / MDM  EKG:   I have reviewed the labs performed to date as well as medications administered while in observation.  Recent changes in the last 24 hours include social work working on Administrator and son.  Plan  Current plan is for placement.    Godfrey Pick, MD 01/01/23 [phone]

## 2023-01-02 DIAGNOSIS — M19011 Primary osteoarthritis, right shoulder: Secondary | ICD-10-CM | POA: Diagnosis not present

## 2023-01-02 NOTE — ED Notes (Signed)
Pt gave watch to RN. RN placed watch in a specimen with label on it and placed it in her assigned locker.

## 2023-01-02 NOTE — ED Provider Notes (Signed)
Emergency Medicine Observation Re-evaluation Note  Dawn Mills is a 72 y.o. female, seen on rounds today.  Pt initially presented to the ED for complaints of advanced dementia, and unable to care for self at home. Pt has been boarding in ED awaiting TOC placement to SNF.  Physical Exam  BP 128/70 (BP Location: Left Arm)   Pulse 91   Temp 98.3 F (36.8 C) (Oral)   Resp 18   Ht 1.651 m (5\' 5" )   Wt 59 kg   SpO2 94%   BMI 21.63 kg/m  Physical Exam General: alert, content, nad.  Cardiac: regular rate. Lungs: breathing comfortably. Psych: calm, nad.  ED Course / MDM   I have reviewed the labs performed to date as well as medications administered while in observation.  Recent changes in the last 24 hours include ED obs, med management, reassessment.   Plan  Pt continues to board in ED awaiting TOC placement.     Lajean Saver, MD 01/02/23 [phone]

## 2023-01-02 NOTE — Progress Notes (Addendum)
This CSW attempted to contact the pt's son to inform he will need to go to DSS to apply for special assistance Medicaid. No answer/unable to leave voicemail. Will try again later.   Addend @ 3:27 AM This CSW spoke to [place] and informed he needed to go to DSS first thing tomorrow tomorrow and apply for special assistance Medicaid for his mom. Lanny Hurst states he will go in the morning to apply. TOC to follow up with Lanny Hurst around 11AM-12PM tomorrow to ensure this is complete. Informed him that we are only able to place her with the special assistance Medicaid. TOC following.

## 2023-01-03 DIAGNOSIS — M19011 Primary osteoarthritis, right shoulder: Secondary | ICD-10-CM | POA: Diagnosis not present

## 2023-01-03 MED ORDER — POLYETHYLENE GLYCOL 3350 17 G PO PACK
17.0000 g | PACK | Freq: Every day | ORAL | Status: DC
  Administered 2023-01-03 – 2023-01-10 (×7): 17 g via ORAL
  Filled 2023-01-03 (×8): qty 1

## 2023-01-03 NOTE — ED Notes (Signed)
Patient alert this shift. Patient cooperative. Patient confused.  Patient medication compliant this shift. Patient ambulatory, unsteady at times. Patient can do ADLs with assist.

## 2023-01-03 NOTE — ED Provider Notes (Signed)
Emergency Medicine Observation Re-evaluation Note  Dawn Mills is a 72 y.o. female, seen on rounds today.  Pt initially presented to the ED for complaints of Arm Injury Currently, the patient is TOC placement  Physical Exam  BP 112/64 (BP Location: Right Arm)   Pulse 73   Temp 97.6 F (36.4 C) (Axillary) Comment (Src): pt. was sleeping  Resp 18   Ht 5\' 5"  (1.651 m)   Wt 59 kg   SpO2 93%   BMI 21.63 kg/m  Physical Exam General: NAD Cardiac: Regular HR Lungs: No respiratory distress Psych: Stable  ED Course / MDM  EKG:   I have reviewed the labs performed to date as well as medications administered while in observation.    Plan  Current plan is for [facility] assistance with placement.    Wyvonnia Dusky, MD 01/03/23 [phone]

## 2023-01-03 NOTE — Progress Notes (Signed)
CSW received a call from Fredia Beets at the [facility] at this time they are declining the patient as they stated that they can not meet th patient needs. TOC will continue to follow.

## 2023-01-03 NOTE — Progress Notes (Addendum)
CSW has faxed over, [facility] [place], [facility], [facility], [place], [facility].  Late note: Addend @8 :04 pm [facility] reports that their admissions director is on vacation until next week can not review until than.

## 2023-01-03 NOTE — Progress Notes (Addendum)
This CSW received a call from  Randalyn Rhea who informed the special assistance application has been submitted and is pending.   Natasha sent over an FL2 and Authorized representative form. TOC to complete and fax forms.   TOC did not complete Authorized representative form, only FL2.  TOC to follow up with [facility] for reconsideration.   Addend @ 11:25 AM [facility]: Candice will call back.  [facility]: Still has concerns about the pt being displaced and have already filled the bed.   Addend @ 12:47 PM  [facility] is reviewing the pt again. Spoke with Ava.   Addend @ 1:18  Faxed over completed and signed FL2 (by MD) to Randalyn Rhea, eligibility specialist at [facility].

## 2023-01-04 DIAGNOSIS — M19011 Primary osteoarthritis, right shoulder: Secondary | ICD-10-CM | POA: Diagnosis not present

## 2023-01-04 NOTE — Progress Notes (Addendum)
CSW has attempted to contact the patient's son at this time the son is still not reachable. This CSW has called the son as well sent a text. TOC will continue to update.    Addend @ 4:11 patient son as texted back and reports he is in the hospital has he has had a heart attack and will not be able to sign his mothers paper work.   This CSW has called the facility, to inquire of ways that the son can sign the paper work for his mom.Unfortunately AVA and Candice have both left the facility today. The staff that I spoke to stated that she is sure that the only way is in person. TOC will have to continue tomorrow to see how the patients paper work can be filled out so that the patient can be DC [facility].

## 2023-01-04 NOTE — ED Provider Notes (Signed)
Emergency Medicine Observation Re-evaluation Note  Dawn Mills is a 72 y.o. female, seen on rounds today.  Pt initially presented to the ED for complaints of Arm Injury Currently, the patient is waiting for transition of care placement.  Physical Exam  BP (!) 107/57 (BP Location: Left Arm)   Pulse 66   Temp (!) 97.5 F (36.4 C) (Oral)   Resp 18   Ht 5\' 5"  (1.651 m)   Wt 59 kg   SpO2 94%   BMI 21.63 kg/m  Physical Exam General: Resting comfortably Cardiac: No murmur on my auscultation Lungs: Clear bilaterally Psych: No Agitation  ED Course / MDM  EKG:   I have reviewed the labs performed to date as well as medications administered while in observation.  Recent changes in the last 24 hours include none reported.  Plan  Current plan is for awaiting TOC assistance with placement.    Gwendy Boeder, Gwenyth Allegra, MD 01/04/23 [phone]

## 2023-01-04 NOTE — Progress Notes (Addendum)
Resent FL2 to [employer] at [facility] via email.   Faxed over additional RN notes to [facility] [phone] ; [phone])  Addend @ 10:24 AM Faxed over referral to [facility] [phone], phone: [phone]).  Addend @ 11:23 AM Kayla from [facility] will be coming to do an in person assessment on tomorrow 2/7.  Addend @ 12:16 PM [facility] has accepted the pt. They are requesting that the pt's son reaches out to complete paperwork as soon as possible. This CSW has contacted the pt's son, Lanny Hurst, unable to leave a voicemail. Followed up via text - awaiting response.   Addend @ 1:49 PM TOC still attempting to reach [place]. He will need to speak with Ava as soon as possible.

## 2023-01-04 NOTE — ED Notes (Signed)
Patient has been alert this shift and cooperative. Patient medication compliant. Confusion noted. Redirectable. Patient ambulatory, unsteady at times.

## 2023-01-04 NOTE — Progress Notes (Addendum)
Addend@7 :27  CSW reached out to [place] from [facility] to see if she was available to still see the patient. Lonn Georgia has agreed to come at 10 or 10:30. TOC will continue to update about patient's DC. Due to the patients son's current health condition we are still exploring other options.

## 2023-01-05 DIAGNOSIS — M19011 Primary osteoarthritis, right shoulder: Secondary | ICD-10-CM | POA: Diagnosis not present

## 2023-01-05 NOTE — ED Notes (Signed)
Currently sleeping  no signs of distress or disturbed sleep respirations are easy with equal rise and fall of chest.

## 2023-01-05 NOTE — Progress Notes (Addendum)
Attempted to contact Ava at [facility] without success.   Addend @ 9:21 AM Ava reports she is going to call this CSW back. She states she will see about scanning paperwork to be signed.   Addend @ 9:39 AM Kayla called to inform she is near. RN notified via secure chat.   Addend @ 10:45 AM Lonn Georgia is working with the facility to figure out the best way to have the son complete the paperwork due to him currently being in the hospital after a heart attack. Provided Kayla with the pt's brother's information as a secondary contact - pt's brother, Mariea Clonts, gave this Probation officer permission to do this. TOC following.

## 2023-01-05 NOTE — ED Notes (Signed)
Sleeping with no signs of distress or sleep disturbance chest rise and fall equal and easy.

## 2023-01-05 NOTE — ED Provider Notes (Signed)
Emergency Medicine Observation Re-evaluation Note  Dawn Mills is a 72 y.o. female, seen on rounds today.  Pt initially presented to the ED for complaints of Arm Injury Currently, the patient is waiting for transition of care placement.  Physical Exam  BP (!) 112/54 (BP Location: Left Arm)   Pulse 64   Temp 97.7 F (36.5 C) (Oral)   Resp 16   Ht 5\' 5"  (1.651 m)   Wt 59 kg   SpO2 93%   BMI 21.63 kg/m  Physical Exam General: Resting comfortably Cardiac: No murmur on my auscultation Lungs: Clear bilaterally Psych: No Agitation  ED Course / MDM  EKG:   I have reviewed the labs performed to date as well as medications administered while in observation.  Recent changes in the last 24 hours include none reported.  Plan  Current plan is for awaiting TOC assistance with placement.      Deno Etienne, DO 01/05/23 0800

## 2023-01-06 ENCOUNTER — Emergency Department (HOSPITAL_COMMUNITY): Payer: Medicare Other

## 2023-01-06 DIAGNOSIS — M19011 Primary osteoarthritis, right shoulder: Secondary | ICD-10-CM | POA: Diagnosis not present

## 2023-01-06 NOTE — ED Notes (Signed)
Dawn Mills is currently sleeping with no signs of distressed or disturbed sleep. Respirations are easy and noted by rise and fall of her chest, skin color WNL.

## 2023-01-06 NOTE — ED Provider Notes (Signed)
Emergency Medicine Observation Re-evaluation Note  Dawn Mills is a 72 y.o. female, seen on rounds today.  Pt initially presented to the ED for complaints of Arm Injury Currently, the patient is sleeping.  Physical Exam  BP 124/65 (BP Location: Left Arm)   Pulse 83   Temp 98.4 F (36.9 C) (Oral)   Resp 16   Ht 5\' 5"  (1.651 m)   Wt 59 kg   SpO2 96%   BMI 21.63 kg/m  Physical Exam General: No distress Cardiac: Well-perfused Lungs: Clear lungs Psych: Calm  ED Course / MDM  EKG:   I have reviewed the labs performed to date as well as medications administered while in observation.  Recent changes in the last 24 hours include awaiting placement.  Plan  Current plan is for awaiting placement.    Ezequiel Essex, MD 01/06/23 [phone]

## 2023-01-06 NOTE — ED Notes (Signed)
Pt is sleeping no distress noted.

## 2023-01-06 NOTE — ED Notes (Signed)
Pt is awake having rambling conversation with self no aggressive behavior noted and pt is easily redirected by staff.

## 2023-01-06 NOTE — ED Notes (Signed)
Patient alert this shift. Patient cooperative with care and redirection. Patient confused.  Patient medication compliant.  Patient appropriate with staff.  Patient can do ADLs with assist.

## 2023-01-06 NOTE — Progress Notes (Addendum)
CM spoke with son Lanny Hurst with [facility] CSW as witness.  Explained that [facility] director can sign admission paperwork on the son's behalf with his permission.  CM made it clear that son would need to complete all the paperwork once he is released from the hospital, as [facility] director can only sign the basic admission paperwork.  Lanny Hurst expresses understanding and verbal agreement for [facility] director to sign on his behalf.  Verbal consent witnessed by this Probation officer and CSW [place] Marlyss Cissell.  I [place] Sophiamarie Nease witnessed Lanny Hurst the patient's son consent to the Director of TOC to sign on behalf of his Mother the patient to be placed in the facility.

## 2023-01-07 DIAGNOSIS — M19011 Primary osteoarthritis, right shoulder: Secondary | ICD-10-CM | POA: Diagnosis not present

## 2023-01-07 NOTE — Progress Notes (Signed)
[facility] will pick up patient on Monday at 1pm.

## 2023-01-07 NOTE — ED Notes (Signed)
Patient sitting up in bed, eating food and watching TV. Patient is pleasant, calm and cooperative. No distress noted.

## 2023-01-07 NOTE — Progress Notes (Deleted)
Rolling walker has been delivered and safe transport has been called.

## 2023-01-07 NOTE — Progress Notes (Signed)
CM sent copies of completed admissions packet, FL2, and chest xray to [facility] admissions rep, Kayla.

## 2023-01-07 NOTE — Progress Notes (Addendum)
Contacted Dawn Mills [phone]) to inform the pt will be discharging to [facility]. Dawn Mills asked this CSW about d/c date and CSW informed it is set for Monday at [place] asked that [facility] contact her to inform pt has been d/c on Monday.

## 2023-01-07 NOTE — ED Notes (Signed)
Patient sitting in bed calm, watching TV. Patient denies any needs at this time. Cooperative and redirectable at this time.

## 2023-01-07 NOTE — ED Provider Notes (Signed)
Emergency Medicine Observation Re-evaluation Note  Kalia Andreen is a 72 y.o. female, seen on rounds today.  Pt initially presented to the ED for complaints of Arm Injury Currently, the patient is sleeping.  Physical Exam  BP 118/62 (BP Location: Left Arm)   Pulse 64   Temp 97.7 F (36.5 C) (Oral)   Resp 18   Ht 5' 5"$  (1.651 m)   Wt 59 kg   SpO2 96%   BMI 21.63 kg/m  Physical Exam General: No distress Cardiac: Well-perfused Lungs: Clear lungs Psych: Calm  ED Course / MDM  EKG:   I have reviewed the labs performed to date as well as medications administered while in observation.  Recent changes in the last 24 hours include none.  Plan  Current plan is for awaiting placement.    Ezequiel Essex, MD 01/07/23 [phone]

## 2023-01-07 NOTE — ED Notes (Signed)
Patient in bed, watching TV and coloring/writing. Patient has no complaints at this time and is calm and cooperative.

## 2023-01-08 DIAGNOSIS — M19011 Primary osteoarthritis, right shoulder: Secondary | ICD-10-CM | POA: Diagnosis not present

## 2023-01-08 NOTE — ED Notes (Signed)
Pt mildly agitated, laying in bed talking to themselves.

## 2023-01-08 NOTE — ED Notes (Signed)
Pt calm and cooperative, watching TV and quietly singing to herself

## 2023-01-08 NOTE — ED Provider Notes (Signed)
Emergency Medicine Observation Re-evaluation Note  Dawn Mills is a 72 y.o. female, seen on rounds today at 0700.  Pt initially presented to the ED for complaints of Arm Injury Currently, the patient is resting comfortably.  Physical Exam  BP 127/67 (BP Location: Left Arm)   Pulse (!) 59   Temp 97.6 F (36.4 C) (Oral)   Resp 15   Ht 5' 5"$  (1.651 m)   Wt 59 kg   SpO2 97%   BMI 21.63 kg/m  Physical Exam General: NAD   ED Course / MDM  EKG:   I have reviewed the labs performed to date as well as medications administered while in observation.  Recent changes in the last 24 hours include no acute events reported.  Plan  Current plan is for placement.    Valarie Merino, MD 01/08/23 [phone]

## 2023-01-08 NOTE — ED Notes (Signed)
Pt given coloring pages. Has bright affect, and pleasant demeanor.

## 2023-01-09 DIAGNOSIS — M19011 Primary osteoarthritis, right shoulder: Secondary | ICD-10-CM | POA: Diagnosis not present

## 2023-01-09 NOTE — ED Provider Notes (Signed)
Emergency Medicine Observation Re-evaluation Note  Dawn Mills is a 72 y.o. female, seen on rounds today.  Pt initially presented to the ED for complaints of Arm Injury Currently, the patient is resting comfortably.  Physical Exam  BP (!) 107/56 (BP Location: Left Arm)   Pulse 71   Temp 97.6 F (36.4 C) (Oral)   Resp 16   Ht 5' 5"$  (1.651 m)   Wt 59 kg   SpO2 97%   BMI 21.63 kg/m  Physical Exam General: No acute distress Cardiac: Regular rate Lungs: No respiratory distress Psych: Calm  ED Course / MDM  EKG:   I have reviewed the labs performed to date as well as medications administered while in observation.  Recent changes in the last 24 hours include no new changes, it appears that patient will be placed to a facility tomorrow.  Plan  Current plan is for holding patient until excepted by facility.    Varney Biles, MD 01/09/23 1005

## 2023-01-09 NOTE — ED Notes (Signed)
Report received from [facility], assumed care of patient at this time. Patient resting in bed, no acute distress noted. Call light within reach. Sitter at bedside.

## 2023-01-09 NOTE — ED Notes (Signed)
Resting with eyes closed, even rise and fall of chest. lights on, sitter continues to watch.

## 2023-01-09 NOTE — ED Notes (Signed)
Pt sitting on edge of bed coloring

## 2023-01-09 NOTE — ED Notes (Signed)
Pt ambulated to bathroom in room, washed hands and walked back to bed. Sitter remains in room with patient.

## 2023-01-10 DIAGNOSIS — M19011 Primary osteoarthritis, right shoulder: Secondary | ICD-10-CM | POA: Diagnosis not present

## 2023-01-10 NOTE — ED Notes (Signed)
Patient resting in bed with eyes closed, respirations even and unlabored, NAD. Sitter at bedside.

## 2023-01-10 NOTE — ED Notes (Signed)
Patient discharged off unit to facility. Patient alert, cooperative, no s/s of distress at this time. Discharge information and belongings given to caregiver for transportation. Patient ambulatory. Patient off unit in w/c, escorted by NT. Patient transported by caregiver

## 2023-01-10 NOTE — ED Provider Notes (Signed)
Emergency Medicine Observation Re-evaluation Note  Dawn Mills is a 72 y.o. female, seen on rounds today.  Pt initially presented to the ED for complaints of Arm Injury Currently, the patient is resting comfortably.  Physical Exam  BP (!) 103/52 (BP Location: Left Arm)   Pulse 68   Temp (!) 97.5 F (36.4 C) (Oral)   Resp 17   Ht 5' 5"$  (1.651 m)   Wt 59 kg   SpO2 96%   BMI 21.63 kg/m  Physical Exam General: nad Lungs: equal chest rise b/l Psych: calm  ED Course / MDM  EKG:   I have reviewed the labs performed to date as well as medications administered while in observation.  Recent changes in the last 24 hours include none.  Plan  Current plan is for tentative plan for placement today.    Jeanell Sparrow, DO 01/10/23 [phone]

## 2023-01-10 NOTE — Progress Notes (Signed)
[facility] will be picking the pt up earlier than 1 PM. They expect to be here 12 or a little after.

## 2023-01-11 NOTE — Progress Notes (Signed)
Faxed over updated FL2.

## 2023-01-11 NOTE — NC FL2 (Signed)
  [place] LEVEL OF CARE FORM     IDENTIFICATION  Patient Name: Dawn Mills Birthdate: 30-Oct-1951 Sex: female Admission Date (Current Location): 12/17/2022  [place] and [place] Number:  Kathleen Argue [id number] [place] and Address:  [facility],  [place], [place]      Provider Number: [id number]  Attending Physician Name and Address:  No att. 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 ([place]) 04/13/2022   Major neurocognitive disorder due to Alzheimer's disease, with behavioral disturbance ([place]) 12/25/2021    Orientation RESPIRATION BLADDER Height & Weight     Self  Normal Continent Weight: 59 kg Height:  5\' 5"  (165.1 cm)  BEHAVIORAL SYMPTOMS/MOOD NEUROLOGICAL BOWEL NUTRITION STATUS  Other (Comment) (Patient has been pleasant while in ED.)   Continent Diet (Regular)  AMBULATORY STATUS COMMUNICATION OF NEEDS Skin   Independent Verbally Normal                       Personal Care Assistance Level of Assistance  Bathing, Feeding, Dressing Bathing Assistance: Limited assistance Feeding assistance: Independent Dressing Assistance: Limited assistance     Functional Limitations Info  Sight, Hearing, Speech Sight Info: Adequate Hearing Info: Adequate Speech Info: Adequate    SPECIAL CARE FACTORS FREQUENCY                       Contractures Contractures Info: Not present    Additional Factors Info  Code Status, Allergies, Psychotropic Code Status Info: Full Allergies Info: None Psychotropic  Info: Seroquel         Current Medications (01/11/2023):   Discharge Medications: cephALEXin 500 MG capsule Commonly known as: KEFLEX Take 1 capsule (500 mg total) by mouth 2 (two) times daily.    cyclobenzaprine 10 MG tablet Commonly known as: FLEXERIL Take 1 tablet (10 mg total) by mouth daily as needed for muscle spasms.   ibuprofen 200 MG tablet Commonly known as: ADVIL    memantine 5 MG tablet Commonly known as: NAMENDA Take 1 tablet (5 mg at night) for 2 weeks, then increase to 1 tablet (5 mg) twice a day   ondansetron 4 MG disintegrating tablet Commonly known as: ZOFRAN-ODT Take 1 tablet (4 mg total) by mouth every 4 (four) hours as needed for nausea or vomiting.   QUEtiapine 50 MG tablet Commonly known as: SEROquel Take 1 tablet (50 mg total) by mouth at bedtime.    Relevant Imaging Results:  Relevant Lab Results:   Additional Information [id number]  Joaquin Courts, RN
